# Patient Record
Sex: Male | Born: 1980 | Race: Black or African American | Hispanic: No | Marital: Married | Smoking: Never smoker
Health system: Southern US, Community
[De-identification: ages and names within clinical notes are randomized; demographics above are authoritative.]

## PROBLEM LIST (undated history)

## (undated) ENCOUNTER — Emergency Department (HOSPITAL_COMMUNITY): Admission: EM | Discharge status: Against Medical Advice | Payer: Self-pay

## (undated) DIAGNOSIS — T7840XA Allergy, unspecified, initial encounter: Secondary | ICD-10-CM

## (undated) DIAGNOSIS — I10 Essential (primary) hypertension: Secondary | ICD-10-CM

## (undated) DIAGNOSIS — K5720 Diverticulitis of large intestine with perforation and abscess without bleeding: Secondary | ICD-10-CM

## (undated) DIAGNOSIS — K651 Peritoneal abscess: Secondary | ICD-10-CM

## (undated) DIAGNOSIS — E119 Type 2 diabetes mellitus without complications: Secondary | ICD-10-CM

## (undated) HISTORY — DX: Allergy, unspecified, initial encounter: T78.40XA

## (undated) HISTORY — DX: Type 2 diabetes mellitus without complications: E11.9

## (undated) HISTORY — DX: Essential (primary) hypertension: I10

## (undated) HISTORY — PX: CARDIAC CATHETERIZATION: SHX172

## (undated) HISTORY — PX: FRACTURE SURGERY: SHX138

---

## 2011-11-27 ENCOUNTER — Ambulatory Visit (HOSPITAL_COMMUNITY)
Admission: RE | Admit: 2011-11-27 | Discharge: 2011-11-27 | Disposition: A | Discharge status: Home / Self Care | Payer: BC Managed Care – PPO | Source: Ambulatory Visit | Attending: Emergency Medicine | Admitting: Emergency Medicine

## 2011-11-27 ENCOUNTER — Emergency Department (HOSPITAL_COMMUNITY)
Admission: EM | Admit: 2011-11-27 | Discharge: 2011-11-27 | Disposition: A | Discharge status: Other Acute Inpatient Hospital | Payer: Self-pay

## 2011-11-27 ENCOUNTER — Ambulatory Visit (INDEPENDENT_AMBULATORY_CARE_PROVIDER_SITE_OTHER): Payer: BC Managed Care – PPO | Admitting: Emergency Medicine

## 2011-11-27 VITALS — BP 146/96 | HR 118 | Temp 100.9°F | Resp 16 | Ht 73.0 in | Wt 363.0 lb

## 2011-11-27 DIAGNOSIS — K7689 Other specified diseases of liver: Secondary | ICD-10-CM | POA: Insufficient documentation

## 2011-11-27 DIAGNOSIS — D72829 Elevated white blood cell count, unspecified: Secondary | ICD-10-CM | POA: Insufficient documentation

## 2011-11-27 DIAGNOSIS — R1031 Right lower quadrant pain: Secondary | ICD-10-CM | POA: Insufficient documentation

## 2011-11-27 DIAGNOSIS — R109 Unspecified abdominal pain: Secondary | ICD-10-CM

## 2011-11-27 DIAGNOSIS — K37 Unspecified appendicitis: Secondary | ICD-10-CM

## 2011-11-27 DIAGNOSIS — K5732 Diverticulitis of large intestine without perforation or abscess without bleeding: Secondary | ICD-10-CM | POA: Insufficient documentation

## 2011-11-27 DIAGNOSIS — R509 Fever, unspecified: Secondary | ICD-10-CM | POA: Insufficient documentation

## 2011-11-27 LAB — POCT CBC
Granulocyte percent: 69.3 %G (ref 37–80)
HCT, POC: 51 % (ref 43.5–53.7)
MCV: 90.4 fL (ref 80–97)
MPV: 8.4 fL (ref 0–99.8)
POC LYMPH PERCENT: 25.5 %L (ref 10–50)
RDW, POC: 13.9 %

## 2011-11-27 LAB — POCT UA - MICROSCOPIC ONLY
Casts, Ur, LPF, POC: NEGATIVE
Crystals, Ur, HPF, POC: NEGATIVE
Epithelial cells, urine per micros: NEGATIVE
Yeast, UA: NEGATIVE

## 2011-11-27 LAB — POCT URINALYSIS DIPSTICK
Leukocytes, UA: NEGATIVE
Nitrite, UA: NEGATIVE
Urobilinogen, UA: 0.2
pH, UA: 5

## 2011-11-27 MED ORDER — IOHEXOL 300 MG/ML  SOLN
100.0000 mL | Freq: Once | INTRAMUSCULAR | Status: AC | PRN
  Administered 2011-11-27: 100 mL via INTRAVENOUS

## 2011-11-27 NOTE — Progress Notes (Signed)
[facility] [address], [place] [address] [phone]  Date:  11/27/2011   Name:  Lawrence Rowland   DOB:  1980-12-01   MRN:  [id number]  PCP:  No primary provider on file.    Chief Complaint: Abdominal Pain and Fever   History of Present Illness:  Lawrence Rowland is a 31 y.o. very pleasant male patient who presents with the following:  Right lower quadrant abdominal pain and nausea and anorexia that started Sunday night.  No vomiting.  Pain initially lower abdomen but migrated into right lower quadrant.  Ate only a few forkfulls of dinner.  No known fever or chills.  Pain is sharp in character.  Last BM 11:00 AM and was scant.  Pain worse when walks or hit bumps in car  There is no problem list on file for this patient.   No past medical history on file.  Past Surgical History  Procedure Date  . Fracture surgery     History  Substance Use Topics  . Smoking status: Never Smoker   . Smokeless tobacco: Not on file  . Alcohol Use: No    Family History  Problem Relation Age of Onset  . Colon cancer Mother   . Diabetes Father     No Known Allergies  Medication list has been reviewed and updated.  No current outpatient prescriptions on file prior to visit.    Review of Systems:  As per HPI, otherwise negative.    Physical Examination: Filed Vitals:   11/27/11 2009  BP: 146/96  Pulse: 118  Temp: 100.9 F (38.3 C)  Resp: 16   Filed Vitals:   11/27/11 2009  Height: 6\' 1"  (1.854 m)  Weight: 363 lb (164.656 kg)   Body mass index is 47.89 kg/(m^2). Ideal Body Weight: Weight in (lb) to have BMI = 25: 189.1   GEN: obese, NAD, Non-toxic, A & O x 3 HEENT: Atraumatic, Normocephalic. Neck supple. No masses, No LAD. Ears and Nose: No external deformity. CV: RRR, No M/G/R. No JVD. No thrill. No extra heart sounds. PULM: CTA B, no wheezes, crackles, rhonchi. No retractions. No resp. distress. No accessory muscle use. ABD: S,  tender RLQ, ND, +BS. Direct and referred rebound in right lower abdomen. No HSM. EXTR: No c/c/e NEURO Normal gait.  PSYCH: Normally interactive. Conversant. Not depressed or anxious appearing.  Calm demeanor.    Assessment and Plan: Appendicitis  CBC and UA NPO To ER  Carmelina Dane, MD

## 2011-11-28 ENCOUNTER — Encounter (HOSPITAL_COMMUNITY): Payer: Self-pay | Admitting: Emergency Medicine

## 2011-11-28 ENCOUNTER — Inpatient Hospital Stay (HOSPITAL_COMMUNITY)
Admission: EM | Admit: 2011-11-28 | Discharge: 2011-12-02 | DRG: 183 | Disposition: A | Discharge status: Home / Self Care | Payer: BC Managed Care – PPO | Attending: General Surgery | Admitting: General Surgery

## 2011-11-28 DIAGNOSIS — K7689 Other specified diseases of liver: Secondary | ICD-10-CM | POA: Diagnosis present

## 2011-11-28 DIAGNOSIS — K760 Fatty (change of) liver, not elsewhere classified: Secondary | ICD-10-CM | POA: Diagnosis present

## 2011-11-28 DIAGNOSIS — K5732 Diverticulitis of large intestine without perforation or abscess without bleeding: Secondary | ICD-10-CM

## 2011-11-28 DIAGNOSIS — I10 Essential (primary) hypertension: Secondary | ICD-10-CM | POA: Diagnosis present

## 2011-11-28 DIAGNOSIS — Z483 Aftercare following surgery for neoplasm: Secondary | ICD-10-CM

## 2011-11-28 DIAGNOSIS — K5720 Diverticulitis of large intestine with perforation and abscess without bleeding: Secondary | ICD-10-CM | POA: Diagnosis present

## 2011-11-28 DIAGNOSIS — R1031 Right lower quadrant pain: Secondary | ICD-10-CM

## 2011-11-28 DIAGNOSIS — E119 Type 2 diabetes mellitus without complications: Secondary | ICD-10-CM | POA: Diagnosis present

## 2011-11-28 DIAGNOSIS — K5780 Diverticulitis of intestine, part unspecified, with perforation and abscess without bleeding: Secondary | ICD-10-CM

## 2011-11-28 LAB — CBC WITH DIFFERENTIAL/PLATELET
Eosinophils Absolute: 0 10*3/uL (ref 0.0–0.7)
Hemoglobin: 15.3 g/dL (ref 13.0–17.0)
Lymphocytes Relative: 23 % (ref 12–46)
Lymphs Abs: 2.9 10*3/uL (ref 0.7–4.0)
Neutro Abs: 8.4 10*3/uL — ABNORMAL HIGH (ref 1.7–7.7)
Neutrophils Relative %: 68 % (ref 43–77)
Platelets: 197 10*3/uL (ref 150–400)
RBC: 5.22 MIL/uL (ref 4.22–5.81)
WBC: 12.4 10*3/uL — ABNORMAL HIGH (ref 4.0–10.5)

## 2011-11-28 LAB — URINALYSIS, ROUTINE W REFLEX MICROSCOPIC
Bilirubin Urine: NEGATIVE
Ketones, ur: 40 mg/dL — AB
Leukocytes, UA: NEGATIVE
Nitrite: NEGATIVE
Protein, ur: NEGATIVE mg/dL
pH: 5 (ref 5.0–8.0)

## 2011-11-28 LAB — BASIC METABOLIC PANEL
BUN: 8 mg/dL (ref 6–23)
Calcium: 8.9 mg/dL (ref 8.4–10.5)
Chloride: 96 mEq/L (ref 96–112)
Creatinine, Ser: 0.79 mg/dL (ref 0.50–1.35)
GFR calc Af Amer: >90 mL/min (ref >90)

## 2011-11-28 LAB — CBC
HCT: 42.1 % (ref 39.0–52.0)
MCHC: 34.2 g/dL (ref 30.0–36.0)
MCV: 85.1 fL (ref 78.0–100.0)
Platelets: 194 10*3/uL (ref 150–400)
RDW: 13.1 % (ref 11.5–15.5)

## 2011-11-28 LAB — COMPREHENSIVE METABOLIC PANEL
ALT: 26 U/L (ref 0–53)
Alkaline Phosphatase: 49 U/L (ref 39–117)
BUN: 8 mg/dL (ref 6–23)
CO2: 23 mEq/L (ref 19–32)
Chloride: 96 mEq/L (ref 96–112)
GFR calc Af Amer: >90 mL/min (ref >90)
GFR calc non Af Amer: >90 mL/min (ref >90)
Glucose, Bld: 277 mg/dL — ABNORMAL HIGH (ref 70–99)
Potassium: 3.7 mEq/L (ref 3.5–5.1)
Sodium: 132 mEq/L — ABNORMAL LOW (ref 135–145)
Total Bilirubin: 0.9 mg/dL (ref 0.3–1.2)
Total Protein: 8 g/dL (ref 6.0–8.3)

## 2011-11-28 MED ORDER — PANTOPRAZOLE SODIUM 40 MG IV SOLR
40.0000 mg | Freq: Every day | INTRAVENOUS | Status: DC
  Administered 2011-11-28 – 2011-12-01 (×4): 40 mg via INTRAVENOUS
  Filled 2011-11-28 (×5): qty 40

## 2011-11-28 MED ORDER — DIPHENHYDRAMINE HCL 50 MG/ML IJ SOLN: 12.5000 mg | Freq: Four times a day (QID) | INTRAMUSCULAR | Status: DC | PRN

## 2011-11-28 MED ORDER — ONDANSETRON HCL 4 MG/2ML IJ SOLN: 4.0000 mg | Freq: Four times a day (QID) | INTRAMUSCULAR | Status: DC | PRN

## 2011-11-28 MED ORDER — ONDANSETRON HCL 4 MG/2ML IJ SOLN
4.0000 mg | Freq: Once | INTRAMUSCULAR | Status: AC
  Administered 2011-11-28: 4 mg via INTRAVENOUS
  Filled 2011-11-28: qty 2

## 2011-11-28 MED ORDER — DIPHENHYDRAMINE HCL 12.5 MG/5ML PO ELIX
12.5000 mg | ORAL_SOLUTION | Freq: Four times a day (QID) | ORAL | Status: DC | PRN
  Filled 2011-11-28: qty 10

## 2011-11-28 MED ORDER — CIPROFLOXACIN IN D5W 400 MG/200ML IV SOLN
400.0000 mg | Freq: Two times a day (BID) | INTRAVENOUS | Status: DC
  Administered 2011-11-28 – 2011-12-02 (×9): 400 mg via INTRAVENOUS
  Filled 2011-11-28 (×10): qty 200

## 2011-11-28 MED ORDER — METRONIDAZOLE IN NACL 5-0.79 MG/ML-% IV SOLN
500.0000 mg | Freq: Once | INTRAVENOUS | Status: AC
  Administered 2011-11-28: 500 mg via INTRAVENOUS
  Filled 2011-11-28: qty 100

## 2011-11-28 MED ORDER — HYDROMORPHONE HCL PF 1 MG/ML IJ SOLN
INTRAMUSCULAR | Status: AC
  Filled 2011-11-28: qty 2

## 2011-11-28 MED ORDER — ENOXAPARIN SODIUM 40 MG/0.4ML ~~LOC~~ SOLN
40.0000 mg | SUBCUTANEOUS | Status: DC
  Administered 2011-11-28 – 2011-12-01 (×4): 40 mg via SUBCUTANEOUS
  Filled 2011-11-28 (×4): qty 0.4

## 2011-11-28 MED ORDER — KCL IN DEXTROSE-NACL 20-5-0.45 MEQ/L-%-% IV SOLN
INTRAVENOUS | Status: DC
  Administered 2011-11-28 – 2011-11-29 (×3): via INTRAVENOUS
  Filled 2011-11-28 (×6): qty 1000

## 2011-11-28 MED ORDER — HYDROMORPHONE HCL PF 1 MG/ML IJ SOLN
1.0000 mg | INTRAMUSCULAR | Status: DC | PRN
  Administered 2011-11-28 – 2011-11-29 (×7): 2 mg via INTRAVENOUS
  Filled 2011-11-28 (×6): qty 2

## 2011-11-28 MED ORDER — HYDROMORPHONE HCL PF 1 MG/ML IJ SOLN
1.0000 mg | INTRAMUSCULAR | Status: AC
Start: 2011-11-28 — End: 2011-11-28
  Administered 2011-11-28: 1 mg via INTRAVENOUS
  Filled 2011-11-28: qty 1

## 2011-11-28 MED ORDER — METRONIDAZOLE IN NACL 5-0.79 MG/ML-% IV SOLN
500.0000 mg | Freq: Three times a day (TID) | INTRAVENOUS | Status: DC
  Administered 2011-11-28 – 2011-12-01 (×9): 500 mg via INTRAVENOUS
  Filled 2011-11-28 (×10): qty 100

## 2011-11-28 MED ORDER — CIPROFLOXACIN IN D5W 400 MG/200ML IV SOLN
400.0000 mg | Freq: Once | INTRAVENOUS | Status: AC
  Administered 2011-11-28: 400 mg via INTRAVENOUS
  Filled 2011-11-28: qty 200

## 2011-11-28 NOTE — ED Notes (Signed)
Family at bedside. 

## 2011-11-28 NOTE — H&P (Signed)
Lawrence Rowland is an 31 y.o. male.   Chief Complaint: Abdominal pain HPI: Patient is an otherwise healthy male who developed lower abdominal discomfort 2 days ago. It became worse the following day and he was evaluated at [facility] urgent care. He was referred for CT scan of the abdomen and pelvis. This demonstrates sigmoid diverticulitis with microperforation. I was asked to evaluate him for admission to our surgical service. He continues to complain of some lower abdominal pain just right of the midline.  History reviewed. No pertinent past medical history.  Past Surgical History  Procedure Date  . Fracture surgery     Family History  Problem Relation Age of Onset  . Colon cancer Mother   . Diabetes Father    Social History:  reports that he has never smoked. He does not have any smokeless tobacco history on file. He reports that he does not drink alcohol or use illicit drugs. Works as an Airline pilot  Allergies: No Known Allergies   (Not in a hospital admission)  Results for orders placed during the hospital encounter of 11/28/11 (from the past 48 hour(s))  CBC WITH DIFFERENTIAL     Status: Abnormal   Collection Time   11/28/11 12:32 AM      Component Value Range Comment   WBC 12.4 (*) 4.0 - 10.5 K/uL    RBC 5.22  4.22 - 5.81 MIL/uL    Hemoglobin 15.3  13.0 - 17.0 g/dL    HCT 41.3  24.4 - 01.0 %    MCV 83.5  78.0 - 100.0 fL    MCH 29.3  26.0 - 34.0 pg    MCHC 35.1  30.0 - 36.0 g/dL    RDW 27.2  53.6 - 64.4 %    Platelets 197  150 - 400 K/uL    Neutrophils Relative 68  43 - 77 %    Neutro Abs 8.4 (*) 1.7 - 7.7 K/uL    Lymphocytes Relative 23  12 - 46 %    Lymphs Abs 2.9  0.7 - 4.0 K/uL    Monocytes Relative 9  3 - 12 %    Monocytes Absolute 1.1 (*) 0.1 - 1.0 K/uL    Eosinophils Relative 0  0 - 5 %    Eosinophils Absolute 0.0  0.0 - 0.7 K/uL    Basophils Relative 0  0 - 1 %    Basophils Absolute 0.0  0.0 - 0.1 K/uL   COMPREHENSIVE METABOLIC PANEL     Status: Abnormal     Collection Time   11/28/11 12:32 AM      Component Value Range Comment   Sodium 132 (*) 135 - 145 mEq/L    Potassium 3.7  3.5 - 5.1 mEq/L    Chloride 96  96 - 112 mEq/L    CO2 23  19 - 32 mEq/L    Glucose, Bld 277 (*) 70 - 99 mg/dL    BUN 8  6 - 23 mg/dL    Creatinine, Ser 0.34  0.50 - 1.35 mg/dL    Calcium 9.2  8.4 - 74.2 mg/dL    Total Protein 8.0  6.0 - 8.3 g/dL    Albumin 3.5  3.5 - 5.2 g/dL    AST 18  0 - 37 U/L    ALT 26  0 - 53 U/L    Alkaline Phosphatase 49  39 - 117 U/L    Total Bilirubin 0.9  0.3 - 1.2 mg/dL    GFR calc non  Af Amer >90  >90 mL/min    GFR calc Af Amer >90  >90 mL/min    Ct Abdomen Pelvis W Contrast  11/27/2011  *RADIOLOGY REPORT*  Clinical Data: Leukocytosis.  Right lower quadrant pain.  CT ABDOMEN AND PELVIS WITH CONTRAST  Technique:  Multidetector CT imaging of the abdomen and pelvis was performed following the standard protocol during bolus administration of intravenous contrast.  Contrast: OMNIPAQUE IOHEXOL 300 MG/ML  SOLN  Comparison: None.  Findings: The lung bases are clear without focal nodule, mass, or airspace disease.  The heart size is normal.  No significant pleural or pericardial effusion is present.  There is diffuse fatty infiltration of the liver.  No focal hepatic lesions are present.  The spleen is within normal limits.  The stomach, duodenum, pancreas are unremarkable.  The common bile duct and gallbladder are normal.  The adrenal glands are normal bilaterally.  The kidneys and ureters are unremarkable.  Diffuse inflammatory changes are present to about the sigmoid colon at a point where it sweeps to the right, just below the appendix. A small amount of free air is present, compatible with a focal perforation.  This may be contained.  There is no discrete abscess. The more proximal colon is remarkable for mild secondary inflammation of the cecum adjacent to the inflamed area. The remainder of the colon is unremarkable.  There is mild focal  inflammation of a loop of small bowel extending past the inflamed sigmoid colon.  The appendix is visualized and within normal limits.  The small bowel is within normal limits.  No significant adenopathy is present.  The urinary bladder is within normal limits.  The bone windows are unremarkable.  IMPRESSION:  1.  Sigmoid diverticulitis. 2.  A small amount of free air is compatible with a focal perforation, likely contained without an abscess. 3.  Secondary inflammation of at least one loop of small bowel adjacent to the diverticulitis. 4.  Hepatic steatosis.   Original Report Authenticated By: [name]. MATTERN, M.D.     Review of Systems  Constitutional: Negative.   HENT: Negative.   Eyes: Negative.   Respiratory: Negative.   Cardiovascular: Negative.   Gastrointestinal: Positive for abdominal pain and constipation.  Genitourinary: Negative.   Skin: Negative.   Neurological: Negative.   Endo/Heme/Allergies: Negative.     Blood pressure 137/87, pulse 97, temperature 98.6 F (37 C), temperature source Oral, resp. rate 16, SpO2 97.00%. Physical Exam  Constitutional: He is oriented to person, place, and time. He appears well-developed and well-nourished. No distress.  HENT:  Head: Normocephalic and atraumatic.  Mouth/Throat: Oropharynx is clear and moist. No oropharyngeal exudate.  Eyes: Pupils are equal, round, and reactive to light.  Neck: Normal range of motion. Neck supple. No tracheal deviation present.  Cardiovascular: Normal rate, regular rhythm, normal heart sounds and intact distal pulses.   Respiratory: Effort normal and breath sounds normal. No respiratory distress. He has no wheezes. He has no rales.  GI: Soft. He exhibits no distension. There is tenderness. There is no rebound and no guarding.       Tenderness lower midline and to the right of lower midline without guarding  Musculoskeletal: Normal range of motion.  Neurological: He is alert and oriented to person,  place, and time.     Assessment/Plan Sigmoid diverticulitis with microperforation. Will admit, place on bowel rest, and continue  IV antibiotics.I advised him if he worsens clinically he may require surgery with likely colostomy. Hopefully we  can get him through this with medical management. I answered his questions. Journiee Feldkamp E 11/28/2011, 2:04 AM

## 2011-11-28 NOTE — Progress Notes (Addendum)
Inpatient Diabetes Program Recommendations  AACE/ADA: New Consensus Statement on Inpatient Glycemic Control (2013)  Target Ranges:  Prepandial:   less than 140 mg/dL      Peak postprandial:   less than 180 mg/dL (1-2 hours)      Critically ill patients:  140 - 180 mg/dL   Results for DJ, SENTENO (MRN [id number]) as of 11/28/2011 10:38  Ref. Range 11/28/2011 00:32 11/28/2011 05:55  Glucose Latest Range: 70-99 mg/dL 782 (H) 956 (H)   May want to also consider checking a HgbA1c level on this patient- Multiple risk factors for DM: Obesity, Family history.  Inpatient Diabetes Program Recommendations Correction (SSI): Please check CBGs and cover with Novolog Sensitive correction scale (SSI) Q4 hours if CBGs are elevated.  Note: Will follow. Ambrose Finland RN, MSN, CDE Diabetes Coordinator Inpatient Diabetes Program [phone]

## 2011-11-28 NOTE — Progress Notes (Signed)
Agree with PA-Osbornes' note. -NPO -Abx Bowel rest

## 2011-11-28 NOTE — ED Provider Notes (Signed)
History     CSN: [id number]  Arrival date & time 11/28/11  0008   First MD Initiated Contact with Patient 11/28/11 0020      Chief Complaint  Patient presents with  . Abdominal Pain    (Consider location/radiation/quality/duration/timing/severity/associated sxs/prior treatment) HPITyreese Rowland is a 30 y.o. male who arrives from [place] urgent care who received a CT showing diverticulitis with small perforation. Since pain began on Sunday as pressure in the right lower cautery this is progressed to sharp 8-10/10 pain, he's had nausea without vomiting, pain does not radiate, is worse with walking or going over bumps in the car. Last time he had anything to eat or drink was 7:00 last night. He did have a bowel movement today at 11:00. This bout of pain was preceded by a day or 2 of constipation. No blood in his stools, no fevers or chills.  History reviewed. No pertinent past medical history.  Past Surgical History  Procedure Date  . Fracture surgery     Family History  Problem Relation Age of Onset  . Colon cancer Mother   . Diabetes Father     History  Substance Use Topics  . Smoking status: Never Smoker   . Smokeless tobacco: Not on file  . Alcohol Use: No      Review of Systems At least 10pt or greater review of systems completed and are negative except where specified in the HPI.  Allergies  Review of patient's allergies indicates no known allergies.  Home Medications  No current outpatient prescriptions on file.  BP 137/87  Pulse 97  Temp 98.6 F (37 C) (Oral)  Resp 16  SpO2 97%  Physical Exam  Nursing notes reviewed.  Electronic medical record reviewed. VITAL SIGNS:   Filed Vitals:   11/28/11 0245 11/28/11 0300 11/28/11 0342 11/28/11 0542  BP: 138/73 126/74 143/74 136/82  Pulse: 100 97 98 91  Temp:   98.6 F (37 C) 98.9 F (37.2 C)  TempSrc:   Oral   Resp: 29 28 22 18   Height:   6\' 1"  (1.854 m)   Weight:   358 lb 14.5 oz (162.8 kg)     SpO2: 90% 91% 94% 95%   CONSTITUTIONAL: Awake, oriented, appears non-toxic HENT: Atraumatic, normocephalic, oral mucosa pink and moist, airway patent. Nares patent without drainage. External ears normal. EYES: Conjunctiva clear, EOMI, PERRLA NECK: Trachea midline, non-tender, supple CARDIOVASCULAR: Normal heart rate, Normal rhythm, No murmurs, rubs, gallops PULMONARY/CHEST: Clear to auscultation, no rhonchi, wheezes, or rales. Symmetrical breath sounds. Non-tender. ABDOMINAL: Non-distended, morbidly obese, soft, tender to palpation in the right lower quadrant with rebound,  BS normal. NEUROLOGIC: Non-focal, moving all four extremities, no gross sensory or motor deficits. EXTREMITIES: No clubbing, cyanosis, or edema SKIN: Warm, Dry, No erythema, No rash  ED Course  Procedures (including critical care time)  Labs Reviewed  CBC WITH DIFFERENTIAL - Abnormal; Notable for the following:    WBC 12.4 (*)     Neutro Abs 8.4 (*)     Monocytes Absolute 1.1 (*)     All other components within normal limits  COMPREHENSIVE METABOLIC PANEL - Abnormal; Notable for the following:    Sodium 132 (*)     Glucose, Bld 277 (*)     All other components within normal limits  URINALYSIS, ROUTINE W REFLEX MICROSCOPIC - Abnormal; Notable for the following:    Glucose, UA 500 (*)     Ketones, ur 40 (*)     All other  components within normal limits  BASIC METABOLIC PANEL - Abnormal; Notable for the following:    Sodium 133 (*)     Glucose, Bld 249 (*)     All other components within normal limits  CBC - Abnormal; Notable for the following:    WBC 12.6 (*)     All other components within normal limits   Ct Abdomen Pelvis W Contrast  11/27/2011  *RADIOLOGY REPORT*  Clinical Data: Leukocytosis.  Right lower quadrant pain.  CT ABDOMEN AND PELVIS WITH CONTRAST  Technique:  Multidetector CT imaging of the abdomen and pelvis was performed following the standard protocol during bolus administration of  intravenous contrast.  Contrast: OMNIPAQUE IOHEXOL 300 MG/ML  SOLN  Comparison: None.  Findings: The lung bases are clear without focal nodule, mass, or airspace disease.  The heart size is normal.  No significant pleural or pericardial effusion is present.  There is diffuse fatty infiltration of the liver.  No focal hepatic lesions are present.  The spleen is within normal limits.  The stomach, duodenum, pancreas are unremarkable.  The common bile duct and gallbladder are normal.  The adrenal glands are normal bilaterally.  The kidneys and ureters are unremarkable.  Diffuse inflammatory changes are present to about the sigmoid colon at a point where it sweeps to the right, just below the appendix. A small amount of free air is present, compatible with a focal perforation.  This may be contained.  There is no discrete abscess. The more proximal colon is remarkable for mild secondary inflammation of the cecum adjacent to the inflamed area. The remainder of the colon is unremarkable.  There is mild focal inflammation of a loop of small bowel extending past the inflamed sigmoid colon.  The appendix is visualized and within normal limits.  The small bowel is within normal limits.  No significant adenopathy is present.  The urinary bladder is within normal limits.  The bone windows are unremarkable.  IMPRESSION:  1.  Sigmoid diverticulitis. 2.  A small amount of free air is compatible with a focal perforation, likely contained without an abscess. 3.  Secondary inflammation of at least one loop of small bowel adjacent to the diverticulitis. 4.  Hepatic steatosis.   Original Report Authenticated By: [name]. MATTERN, M.D.      1. Right lower quadrant abdominal pain   2. Diverticulitis with perforation       MDM  Lawrence Rowland is a 31 y.o. male presenting with right lower quadrant pain it is actually a diverticulitis with a small perforation. This is diagnosed by CT-patient does have rebound  tenderness. Starting the patient on fluids, pain control, antiemetics and IV antibiotics metronidazole and ciprofloxacin. Have discussed patient's case with surgery they will admit the patient for observation, further diagnostics and treatment.  Appendix was well-visualized on this scan-does not appear to be appendicitis. Patient is nontoxic and afebrile in the emergency department his pain is reasonably well-controlled.         Jones Skene, MD 11/28/11 4782

## 2011-11-28 NOTE — Progress Notes (Signed)
UR chart review completed.  

## 2011-11-28 NOTE — ED Notes (Signed)
Pt in route for transport.

## 2011-11-28 NOTE — Progress Notes (Signed)
Patient ID: Lawrence Rowland, male   DOB: [birth year], 31 y.o.   MRN: [id number]    Subjective: Pt feels about the same as when he came in.  Still with quite a bit of pain on the right side.  No nausea or vomiting  Objective: Vital signs in last 24 hours: Temp:  [98.6 F (37 C)-100.9 F (38.3 C)] 98.9 F (37.2 C) (10/30 0542) Pulse Rate:  [91-118] 91  (10/30 0542) Resp:  [14-31] 18  (10/30 0542) BP: (126-146)/(70-104) 136/82 mmHg (10/30 0542) SpO2:  [90 %-97 %] 95 % (10/30 0542) Weight:  [358 lb 14.5 oz (162.8 kg)-363 lb (164.656 kg)] 358 lb 14.5 oz (162.8 kg) (10/30 0342)    Intake/Output from previous day:   Intake/Output this shift:    PE: Abd: soft, tender mostly in RLQ, few BS, obese, ND Heart: regular Lungs: CTAB  Lab Results:   Basename 11/28/11 0555 11/28/11 0032  WBC 12.6* 12.4*  HGB 14.4 15.3  HCT 42.1 43.6  PLT 194 197   BMET  Basename 11/28/11 0555 11/28/11 0032  NA 133* 132*  K 3.5 3.7  CL 96 96  CO2 26 23  GLUCOSE 249* 277*  BUN 8 8  CREATININE 0.79 0.79  CALCIUM 8.9 9.2   PT/INR No results found for this basename: LABPROT:2,INR:2 in the last 72 hours CMP     Component Value Date/Time   NA 133* 11/28/2011 0555   K 3.5 11/28/2011 0555   CL 96 11/28/2011 0555   CO2 26 11/28/2011 0555   GLUCOSE 249* 11/28/2011 0555   BUN 8 11/28/2011 0555   CREATININE 0.79 11/28/2011 0555   CALCIUM 8.9 11/28/2011 0555   PROT 8.0 11/28/2011 0032   ALBUMIN 3.5 11/28/2011 0032   AST 18 11/28/2011 0032   ALT 26 11/28/2011 0032   ALKPHOS 49 11/28/2011 0032   BILITOT 0.9 11/28/2011 0032   GFRNONAA >90 11/28/2011 0555   GFRAA >90 11/28/2011 0555   Lipase  No results found for this basename: lipase       Studies/Results: Ct Abdomen Pelvis W Contrast  11/27/2011  *RADIOLOGY REPORT*  Clinical Data: Leukocytosis.  Right lower quadrant pain.  CT ABDOMEN AND PELVIS WITH CONTRAST  Technique:  Multidetector CT imaging of the abdomen and pelvis was performed  following the standard protocol during bolus administration of intravenous contrast.  Contrast: OMNIPAQUE IOHEXOL 300 MG/ML  SOLN  Comparison: None.  Findings: The lung bases are clear without focal nodule, mass, or airspace disease.  The heart size is normal.  No significant pleural or pericardial effusion is present.  There is diffuse fatty infiltration of the liver.  No focal hepatic lesions are present.  The spleen is within normal limits.  The stomach, duodenum, pancreas are unremarkable.  The common bile duct and gallbladder are normal.  The adrenal glands are normal bilaterally.  The kidneys and ureters are unremarkable.  Diffuse inflammatory changes are present to about the sigmoid colon at a point where it sweeps to the right, just below the appendix. A small amount of free air is present, compatible with a focal perforation.  This may be contained.  There is no discrete abscess. The more proximal colon is remarkable for mild secondary inflammation of the cecum adjacent to the inflamed area. The remainder of the colon is unremarkable.  There is mild focal inflammation of a loop of small bowel extending past the inflamed sigmoid colon.  The appendix is visualized and within normal limits.  The small bowel  is within normal limits.  No significant adenopathy is present.  The urinary bladder is within normal limits.  The bone windows are unremarkable.  IMPRESSION:  1.  Sigmoid diverticulitis. 2.  A small amount of free air is compatible with a focal perforation, likely contained without an abscess. 3.  Secondary inflammation of at least one loop of small bowel adjacent to the diverticulitis. 4.  Hepatic steatosis.   Original Report Authenticated By: [name]. MATTERN, M.D.     Anti-infectives: Anti-infectives     Start     Dose/Rate Route Frequency Ordered Stop   11/28/11 0800   metroNIDAZOLE (FLAGYL) IVPB 500 mg        500 mg 100 mL/hr over 60 Minutes Intravenous Every 8 hours 11/28/11 0342      11/28/11 0400   ciprofloxacin (CIPRO) IVPB 400 mg        400 mg 200 mL/hr over 60 Minutes Intravenous Every 12 hours 11/28/11 0342     11/28/11 0045   metroNIDAZOLE (FLAGYL) IVPB 500 mg        500 mg 100 mL/hr over 60 Minutes Intravenous  Once 11/28/11 0032 11/28/11 0249   11/28/11 0045   ciprofloxacin (CIPRO) IVPB 400 mg        400 mg 200 mL/hr over 60 Minutes Intravenous  Once 11/28/11 0032 11/28/11 0353           Assessment/Plan  1. Diverticulitis, microperforation 2. Morbid obesity  Plan: 1. Cont NPO and bowel rest 2. Cont IV abx therapy, if patient does not improve or WBC begins to trend back up may need to switch to something stronger than Cipro Flagyl, such as zosyn or Invanz. 3. mobilize   LOS: 0 days    Terrelle Ruffolo E 11/28/2011, 8:39 AM Pager: [phone]

## 2011-11-28 NOTE — ED Notes (Signed)
PT. REPORTS MID ABDOMINAL PAIN WITH  NAUSEA ONSET LAST SUNDAY WORSE TODAY SEN AT [facility] SENT HERE FOR ABDOMINAL CT SCAN RESULTS SHOWS DIVERTICULITIS  / PERFORATION .

## 2011-11-29 DIAGNOSIS — E119 Type 2 diabetes mellitus without complications: Secondary | ICD-10-CM

## 2011-11-29 LAB — CBC
MCH: 29.3 pg (ref 26.0–34.0)
MCHC: 34.3 g/dL (ref 30.0–36.0)
Platelets: 186 10*3/uL (ref 150–400)

## 2011-11-29 LAB — GLUCOSE, CAPILLARY
Glucose-Capillary: 165 mg/dL — ABNORMAL HIGH (ref 70–99)
Glucose-Capillary: 167 mg/dL — ABNORMAL HIGH (ref 70–99)

## 2011-11-29 MED ORDER — INSULIN ASPART 100 UNIT/ML ~~LOC~~ SOLN
0.0000 [IU] | Freq: Three times a day (TID) | SUBCUTANEOUS | Status: DC
  Administered 2011-11-29: 7 [IU] via SUBCUTANEOUS
  Administered 2011-11-30: 4 [IU] via SUBCUTANEOUS

## 2011-11-29 MED ORDER — POTASSIUM CHLORIDE IN NACL 20-0.9 MEQ/L-% IV SOLN
INTRAVENOUS | Status: DC
  Administered 2011-11-29 – 2011-12-01 (×4): via INTRAVENOUS
  Filled 2011-11-29 (×8): qty 1000

## 2011-11-29 NOTE — Progress Notes (Signed)
Agree with PA-Dort's PN -Eval HgbA1C -patient may need ISS -COn't abx -ambulate

## 2011-11-29 NOTE — Progress Notes (Addendum)
Patient ID: Lawrence Rowland, male   DOB: 01-26-1981, 31 y.o.   MRN: [id number]    Subjective: Pt feeling about 40-50% better than when he came in today.  Pt had a BM yesterday, urinating okay, pt still experiencing some nausea and anorexia, but is thirsty.  Pt tolerating ice chips okay.  Pt ambulating OOB.  Objective: Vital signs in last 24 hours: Temp:  [98.7 F (37.1 C)-100.6 F (38.1 C)] 98.9 F (37.2 C) (10/31 0621) Pulse Rate:  [89-95] 89  (10/31 0621) Resp:  [18] 18  (10/31 0621) BP: (134-151)/(73-83) 137/83 mmHg (10/31 0621) SpO2:  [93 %-99 %] 96 % (10/31 0621) Last BM Date: 11/28/11  Intake/Output from previous day: 10/30 0701 - 10/31 0700 In: 3264.3 [I.V.:2664.3; IV Piggyback:600] Out: -  Intake/Output this shift:    PE: Gen:  Alert, NAD, pleasant Abd: Soft, +BS, entire abdomen moderate TTP which refers pain to RLQ, some mild guarding, mild distension   Lab Results:   Basename 11/29/11 0500 11/28/11 0555  WBC 12.0* 12.6*  HGB 13.7 14.4  HCT 39.9 42.1  PLT 186 194   BMET  Basename 11/28/11 0555 11/28/11 0032  NA 133* 132*  K 3.5 3.7  CL 96 96  CO2 26 23  GLUCOSE 249* 277*  BUN 8 8  CREATININE 0.79 0.79  CALCIUM 8.9 9.2   PT/INR No results found for this basename: LABPROT:2,INR:2 in the last 72 hours CMP     Component Value Date/Time   NA 133* 11/28/2011 0555   K 3.5 11/28/2011 0555   CL 96 11/28/2011 0555   CO2 26 11/28/2011 0555   GLUCOSE 249* 11/28/2011 0555   BUN 8 11/28/2011 0555   CREATININE 0.79 11/28/2011 0555   CALCIUM 8.9 11/28/2011 0555   PROT 8.0 11/28/2011 0032   ALBUMIN 3.5 11/28/2011 0032   AST 18 11/28/2011 0032   ALT 26 11/28/2011 0032   ALKPHOS 49 11/28/2011 0032   BILITOT 0.9 11/28/2011 0032   GFRNONAA >90 11/28/2011 0555   GFRAA >90 11/28/2011 0555   Lipase  No results found for this basename: lipase       Studies/Results: Ct Abdomen Pelvis W Contrast  11/27/2011  *RADIOLOGY REPORT*  Clinical Data:  Leukocytosis.  Right lower quadrant pain.  CT ABDOMEN AND PELVIS WITH CONTRAST  Technique:  Multidetector CT imaging of the abdomen and pelvis was performed following the standard protocol during bolus administration of intravenous contrast.  Contrast: OMNIPAQUE IOHEXOL 300 MG/ML  SOLN  Comparison: None.  Findings: The lung bases are clear without focal nodule, mass, or airspace disease.  The heart size is normal.  No significant pleural or pericardial effusion is present.  There is diffuse fatty infiltration of the liver.  No focal hepatic lesions are present.  The spleen is within normal limits.  The stomach, duodenum, pancreas are unremarkable.  The common bile duct and gallbladder are normal.  The adrenal glands are normal bilaterally.  The kidneys and ureters are unremarkable.  Diffuse inflammatory changes are present to about the sigmoid colon at a point where it sweeps to the right, just below the appendix. A small amount of free air is present, compatible with a focal perforation.  This may be contained.  There is no discrete abscess. The more proximal colon is remarkable for mild secondary inflammation of the cecum adjacent to the inflamed area. The remainder of the colon is unremarkable.  There is mild focal inflammation of a loop of small bowel extending past the inflamed  sigmoid colon.  The appendix is visualized and within normal limits.  The small bowel is within normal limits.  No significant adenopathy is present.  The urinary bladder is within normal limits.  The bone windows are unremarkable.  IMPRESSION:  1.  Sigmoid diverticulitis. 2.  A small amount of free air is compatible with a focal perforation, likely contained without an abscess. 3.  Secondary inflammation of at least one loop of small bowel adjacent to the diverticulitis. 4.  Hepatic steatosis.   Original Report Authenticated By: [name]. MATTERN, M.D.     Anti-infectives: Anti-infectives     Start     Dose/Rate Route  Frequency Ordered Stop   11/28/11 0800   metroNIDAZOLE (FLAGYL) IVPB 500 mg        500 mg 100 mL/hr over 60 Minutes Intravenous Every 8 hours 11/28/11 0342     11/28/11 0400   ciprofloxacin (CIPRO) IVPB 400 mg        400 mg 200 mL/hr over 60 Minutes Intravenous Every 12 hours 11/28/11 0342     11/28/11 0045   metroNIDAZOLE (FLAGYL) IVPB 500 mg        500 mg 100 mL/hr over 60 Minutes Intravenous  Once 11/28/11 0032 11/28/11 0249   11/28/11 0045   ciprofloxacin (CIPRO) IVPB 400 mg        400 mg 200 mL/hr over 60 Minutes Intravenous  Once 11/28/11 0032 11/28/11 0353           Assessment/Plan Diverticulitis micro perf 1.  Will keep NPO except ice chips given some nausea and continued pain 2.  Cont. Ambulation OOB 3.  IS 4.  Cont. Antibiotics  Morbid Obesity, Elevated glucose (previously diagnosed with Pre-DM & was on metformin) 1.  Switch from D5W to NS, if not improved by this afternoon will start on sliding scale 2.  May need medicine consult prior to discharge/PCP referral   LOS: 1 day    Aris Georgia 11/29/2011, 7:54 AM Pager: [phone]   Time:  1328 Started patient on Insulin sliding scale due to blood glucose readings >200 even after d/c D5W.  If not improved by tomorrow AM will consult medicine on management of diabetes.

## 2011-11-30 DIAGNOSIS — R1031 Right lower quadrant pain: Secondary | ICD-10-CM

## 2011-11-30 LAB — GLUCOSE, CAPILLARY
Glucose-Capillary: 181 mg/dL — ABNORMAL HIGH (ref 70–99)
Glucose-Capillary: 178 mg/dL — ABNORMAL HIGH (ref 70–99)
Glucose-Capillary: 269 mg/dL — ABNORMAL HIGH (ref 70–99)

## 2011-11-30 LAB — HEMOGLOBIN A1C: Hgb A1c MFr Bld: 13.5 % — ABNORMAL HIGH (ref <5.7)

## 2011-11-30 MED ORDER — INSULIN GLARGINE 100 UNIT/ML ~~LOC~~ SOLN
10.0000 [IU] | Freq: Every day | SUBCUTANEOUS | Status: DC
  Administered 2011-11-30: 10 [IU] via SUBCUTANEOUS

## 2011-11-30 MED ORDER — INSULIN ASPART 100 UNIT/ML ~~LOC~~ SOLN
0.0000 [IU] | Freq: Three times a day (TID) | SUBCUTANEOUS | Status: DC
  Administered 2011-11-30: 8 [IU] via SUBCUTANEOUS
  Administered 2011-11-30: 3 [IU] via SUBCUTANEOUS

## 2011-11-30 MED ORDER — LIVING WELL WITH DIABETES BOOK
Freq: Once | Status: AC
  Administered 2011-11-30: 18:00:00
  Filled 2011-11-30: qty 1

## 2011-11-30 MED ORDER — INSULIN ASPART 100 UNIT/ML ~~LOC~~ SOLN
0.0000 [IU] | SUBCUTANEOUS | Status: DC
  Administered 2011-12-01 (×6): 3 [IU] via SUBCUTANEOUS

## 2011-11-30 NOTE — Progress Notes (Signed)
Rn gave patient Living Well with Diabetes booklet. Patient has watched 4 videos on Dm and plans to watch the rest this evening. Rn instructed patient on how to draw up insulin and inject self. Pt is aware that at this point we aren't sure whether he will go home on insulin or oral meds for DM. Pt was able to draw up insulin and Rn gave med to patient. Pt said that he was at one point on metformin but his PCP stopped it because he started working out and lost weight. Pt however did not check his blood sugars at home. Rn informed patient that he would be checking his blood sugars at home. Rn will report off to next nurse that patient needs to watch NT's when taking blood sugar and needs to be taught how to take it. Pt knows to call if he has any questions.

## 2011-11-30 NOTE — Progress Notes (Signed)
  RD consulted for nutrition education regarding diabetes.  RD met with pt at bedside to identify primary learner and learning style.  Pt states he is responsible for his meals.  He reports his learning style as discussion preferred with handouts for later reference.  Lab Results  Component Value Date   HGBA1C 13.5* 11/29/2011    RD provided "Carbohydrate Counting for People with Diabetes" handout from the Academy of [employer]. Discussed different food groups and their effects on blood sugar, emphasizing carbohydrate-containing foods. Provided list of carbohydrates and recommended serving sizes of common foods.  Discussed importance of controlled and consistent carbohydrate intake throughout the day. Provided examples of ways to balance meals/snacks and encouraged intake of high-fiber, whole grain complex carbohydrates.  Pt reports previous dx with diabetes several years ago for which he used metformin.  At some point during that time, pt reports he was able to achieve good control and stopped medication use.  He had a feeling his blood sugars were not in good control, but reports some denial.  His ultimate goal is to get off insulin as he does not like shots.  He also has long term goals of being healthy and at a more healthy weight.  He has tried several diets (fad, restrictive, and general) without much success.  Expect good compliance.  Body mass index is 47.35 kg/(m^2). Pt meets criteria for morbidly obese based on current BMI.  Current diet order is clear liquid, patient is consuming approximately 100% of meals at this time. Labs and medications reviewed. No further nutrition interventions warranted at this time. RD contact information provided. If additional nutrition issues arise, please re-consult RD.  Pt would greatly benefit from outpatient education classes.  Loyce Dys, MS RD LDN Clinical Inpatient Dietitian Pager: [phone] Weekend/After hours pager:  [phone]

## 2011-11-30 NOTE — Progress Notes (Signed)
11/30/11  Diabetes Coordinator consult:  Patient diagnosed with new onset diabetes. HgbA1C is 13.5 %.  CBGs are now less than 180 mg/dl with Novolog MODERATE correction scale AC & HS.  Lantus 10 units started today at 1000.  Would need to see how this effects his blood sugars over a 24 hour period.   In consideration of weight, infection, and CBGs starting at 249 mg/dl yesterday, the recommendation at this time would be to send him home on Metformin and be followed by PCP. Staff nurses can have patient watch DM videos, order Living Well with Diabetes booklet, give patient ExitCare notes on diabetes and teach patient to do own CBGs.  Will need a prescription for blood glucose meter, strips, etc.at discharge.  Dietician has already seen the patient and has given information to follow.  Will need outpatient DM education depending on physician he sees as outpatient.  Smith Mince RN BSN CDE

## 2011-11-30 NOTE — Consult Note (Signed)
[facility] History and Physical  Lawrence Rowland ZOX:[id number] DOB: 1981/01/24 DOA: 11/28/2011  Referring physician: Liz Malady, MD  PCP: [name], MD   Chief Complaint: Hemoglobin A1c of 13.5 HPI:  This is a 31 year old male who was admitted on the 30th for abdominal pain to the surgical service. CT scan showed sigmoid diverticulitis with microperforation. Patient was admitted for IV antibiotics. During this admission he was noted to have elevated CBGs with one random CBG greater than 300. Hemoglobin A1c was checked and was found to be 13.5. Patient does not have any history of diabetes. Currently is well hydrated and asymptomatic from it.     Review of Systems: negative for the following  Constitutional: Denies fever, chills, diaphoresis, appetite change and fatigue.  HEENT: Denies photophobia, eye pain, redness, hearing loss, ear pain, congestion, sore throat, rhinorrhea, sneezing, mouth sores, trouble swallowing, neck pain, neck stiffness and tinnitus.  Respiratory: Denies SOB, DOE, cough, chest tightness, and wheezing.  Cardiovascular: Denies chest pain, palpitations and leg swelling.  Gastrointestinal: Denies nausea, vomiting, abdominal pain, diarrhea, constipation, blood in stool and abdominal distention.  Genitourinary: Denies dysuria, urgency, frequency, hematuria, flank pain and difficulty urinating.  Musculoskeletal: Denies myalgias, back pain, joint swelling, arthralgias and gait problem.  Skin: Denies pallor, rash and wound.  Neurological: Denies dizziness, seizures, syncope, weakness, light-headedness, numbness and headaches.  Hematological: Denies adenopathy. Easy bruising, personal or family bleeding history  Psychiatric/Behavioral: Denies suicidal ideation, mood changes, confusion, nervousness, sleep disturbance and agitation       History reviewed. No pertinent past medical history.   Past Surgical History  Procedure Date  . Fracture  surgery       Social History:  reports that he has never smoked. He does not have any smokeless tobacco history on file. He reports that he does not drink alcohol or use illicit drugs.  No Known Allergies  Family History  Problem Relation Age of Onset  . Colon cancer Mother   . Diabetes Father      Prior to Admission medications   Medication Sig Start Date End Date Taking? Authorizing Provider  polyethylene glycol powder (GLYCOLAX/MIRALAX) powder Take 17 g by mouth daily as needed. For constipation   Yes Historical Provider, MD     Physical Exam: Filed Vitals:   11/29/11 8119 11/29/11 1403 11/29/11 2212 11/30/11 0541  BP: 137/83 133/74 140/79 124/72  Pulse: 89 81 96 84  Temp: 98.9 F (37.2 C) 98.3 F (36.8 C) 99.6 F (37.6 C) 98 F (36.7 C)  TempSrc: Oral Oral Oral Oral  Resp: 18 20 20 20   Height:      Weight:      SpO2: 96% 96% 92% 94%     Constitutional: Vital signs reviewed. Patient is a well-developed and well-nourished in no acute distress and cooperative with exam. Alert and oriented x3.  Head: Normocephalic and atraumatic  Ear: TM normal bilaterally  Mouth: no erythema or exudates, MMM  Eyes: PERRL, EOMI, conjunctivae normal, No scleral icterus.  Neck: Supple, Trachea midline normal ROM, No JVD, mass, thyromegaly, or carotid bruit present.  Cardiovascular: RRR, S1 normal, S2 normal, no MRG, pulses symmetric and intact bilaterally  Pulmonary/Chest: CTAB, no wheezes, rales, or rhonchi  GI: Soft. He exhibits no distension. There is tenderness. There is no rebound and no guarding.  Tenderness lower midline and to the right of lower midline without guarding  Musculoskeletal: No joint deformities, erythema, or stiffness, ROM full and no nontender Ext: no edema and no cyanosis, pulses  palpable bilaterally (DP and PT)  Hematology: no cervical, inginal, or axillary adenopathy.  Neurological: A&O x3, Strenght is normal and symmetric bilaterally, cranial nerve II-XII  are grossly intact, no focal motor deficit, sensory intact to light touch bilaterally.  Skin: Warm, dry and intact. No rash, cyanosis, or clubbing.  Psychiatric: Normal mood and affect. speech and behavior is normal. Judgment and thought content normal. Cognition and memory are normal.       Labs on Admission:    Basic Metabolic Panel:  Lab 11/28/11 1610 11/28/11 0032  NA 133* 132*  K 3.5 3.7  CL 96 96  CO2 26 23  GLUCOSE 249* 277*  BUN 8 8  CREATININE 0.79 0.79  CALCIUM 8.9 9.2  MG -- --  PHOS -- --   Liver Function Tests:  Lab 11/28/11 0032  AST 18  ALT 26  ALKPHOS 49  BILITOT 0.9  PROT 8.0  ALBUMIN 3.5   No results found for this basename: LIPASE:5,AMYLASE:5 in the last 168 hours No results found for this basename: AMMONIA:5 in the last 168 hours CBC:  Lab 11/29/11 0500 11/28/11 0555 11/28/11 0032 11/27/11 2031  WBC 12.0* 12.6* 12.4* 13.1*  NEUTROABS -- -- 8.4* --  HGB 13.7 14.4 15.3 15.8  HCT 39.9 42.1 43.6 51.0  MCV 85.3 85.1 83.5 90.4  PLT 186 194 197 --   Cardiac Enzymes: No results found for this basename: CKTOTAL:5,CKMB:5,CKMBINDEX:5,TROPONINI:5 in the last 168 hours  BNP (last 3 results) No results found for this basename: PROBNP:3 in the last 8760 hours    CBG:  Lab 11/30/11 0820 11/29/11 2208 11/29/11 2014 11/29/11 1715 11/29/11 1129  GLUCAP 181* 167* 165* 217* 217*    Radiological Exams on Admission: No results found.  EKG: Independently reviewed. Stable   Assessment/Plan   1. New onset diabetes with hemoglobin A1c of 13.5, minimum by mouth intake on clear liquid diet therefore the patient will be managed with insulin. He will need insulin teaching. Given his elevated hemoglobin A1c he will need to be discharged on Lantus and pre-meal insulin. Will have diabetes coordinator suggested dosages, verify insurance and affordability. 2. Diverticulitis being managed by surgery 3. Thank you for this consult we'll continue to follow  along  Code Status:   full Family Communication: bedside Disposition Plan: admit   Time spent: 70 mins   [facility] Pager [phone]  If 7PM-7AM, please contact night-coverage [email or web address] Password [facility] 11/30/2011, 9:36 AM

## 2011-11-30 NOTE — Progress Notes (Signed)
Patient ID: Lawrence Rowland, male   DOB: 02-14-1980, 31 y.o.   MRN: [id number]    Subjective: Pt feeling much better this morning.  Abdominal pain much improved.  No nausea.  Objective: Vital signs in last 24 hours: Temp:  [98 F (36.7 C)-99.6 F (37.6 C)] 98 F (36.7 C) (11/01 0541) Pulse Rate:  [81-96] 84  (11/01 0541) Resp:  [20] 20  (11/01 0541) BP: (124-140)/(72-79) 124/72 mmHg (11/01 0541) SpO2:  [92 %-96 %] 94 % (11/01 0541) Last BM Date: 11/29/11  Intake/Output from previous day: 10/31 0701 - 11/01 0700 In: 3257.6 [I.V.:2557.6; IV Piggyback:700] Out: -  Intake/Output this shift:    PE: Abd: soft, less tender on the right side, +BS, ND, obese Heart: regular Lungs: CTAB  Lab Results:   Basename 11/29/11 0500 11/28/11 0555  WBC 12.0* 12.6*  HGB 13.7 14.4  HCT 39.9 42.1  PLT 186 194   BMET  Basename 11/28/11 0555 11/28/11 0032  NA 133* 132*  K 3.5 3.7  CL 96 96  CO2 26 23  GLUCOSE 249* 277*  BUN 8 8  CREATININE 0.79 0.79  CALCIUM 8.9 9.2   PT/INR No results found for this basename: LABPROT:2,INR:2 in the last 72 hours CMP     Component Value Date/Time   NA 133* 11/28/2011 0555   K 3.5 11/28/2011 0555   CL 96 11/28/2011 0555   CO2 26 11/28/2011 0555   GLUCOSE 249* 11/28/2011 0555   BUN 8 11/28/2011 0555   CREATININE 0.79 11/28/2011 0555   CALCIUM 8.9 11/28/2011 0555   PROT 8.0 11/28/2011 0032   ALBUMIN 3.5 11/28/2011 0032   AST 18 11/28/2011 0032   ALT 26 11/28/2011 0032   ALKPHOS 49 11/28/2011 0032   BILITOT 0.9 11/28/2011 0032   GFRNONAA >90 11/28/2011 0555   GFRAA >90 11/28/2011 0555   Lipase  No results found for this basename: lipase       Studies/Results: No results found.  Anti-infectives: Anti-infectives     Start     Dose/Rate Route Frequency Ordered Stop   11/28/11 0800   metroNIDAZOLE (FLAGYL) IVPB 500 mg        500 mg 100 mL/hr over 60 Minutes Intravenous Every 8 hours 11/28/11 0342     11/28/11 0400    ciprofloxacin (CIPRO) IVPB 400 mg        400 mg 200 mL/hr over 60 Minutes Intravenous Every 12 hours 11/28/11 0342     11/28/11 0045   metroNIDAZOLE (FLAGYL) IVPB 500 mg        500 mg 100 mL/hr over 60 Minutes Intravenous  Once 11/28/11 0032 11/28/11 0249   11/28/11 0045   ciprofloxacin (CIPRO) IVPB 400 mg        400 mg 200 mL/hr over 60 Minutes Intravenous  Once 11/28/11 0032 11/28/11 0353           Assessment/Plan  1. Diverticulitis with microperf, no abscess 2. New onset DM 3. Morbid obesity  Plan: 1. Will start patient on clear liquids 2. HGBA1C is 13.5.  I have contacted internal medicine as well as the dietician to assist the patient with management of new onset DM.  I have also d/w him that he will need to get a PCP once he gets out of here to manage this problem. 3. Cont abx therapy, C/F.   LOS: 2 days    French Kendra E 11/30/2011, 8:35 AM Pager: [phone]

## 2011-11-30 NOTE — Progress Notes (Signed)
Pt with decreased pain.  Trial Clears. IM consult for elev HgbA1C -con't Abx

## 2011-12-01 DIAGNOSIS — E119 Type 2 diabetes mellitus without complications: Secondary | ICD-10-CM | POA: Diagnosis present

## 2011-12-01 DIAGNOSIS — K5720 Diverticulitis of large intestine with perforation and abscess without bleeding: Secondary | ICD-10-CM | POA: Diagnosis present

## 2011-12-01 DIAGNOSIS — I10 Essential (primary) hypertension: Secondary | ICD-10-CM

## 2011-12-01 LAB — GLUCOSE, CAPILLARY
Glucose-Capillary: 179 mg/dL — ABNORMAL HIGH (ref 70–99)
Glucose-Capillary: 197 mg/dL — ABNORMAL HIGH (ref 70–99)
Glucose-Capillary: 182 mg/dL — ABNORMAL HIGH (ref 70–99)

## 2011-12-01 LAB — BASIC METABOLIC PANEL
CO2: 24 mEq/L (ref 19–32)
Chloride: 102 mEq/L (ref 96–112)
GFR calc non Af Amer: >90 mL/min (ref >90)
Glucose, Bld: 188 mg/dL — ABNORMAL HIGH (ref 70–99)
Potassium: 3.5 mEq/L (ref 3.5–5.1)
Sodium: 136 mEq/L (ref 135–145)

## 2011-12-01 LAB — CBC
Hemoglobin: 13.6 g/dL (ref 13.0–17.0)
RBC: 4.69 MIL/uL (ref 4.22–5.81)
WBC: 8.1 10*3/uL (ref 4.0–10.5)

## 2011-12-01 MED ORDER — INSULIN GLARGINE 100 UNIT/ML ~~LOC~~ SOLN
20.0000 [IU] | Freq: Every day | SUBCUTANEOUS | Status: DC
  Administered 2011-12-01: 20 [IU] via SUBCUTANEOUS

## 2011-12-01 MED ORDER — SODIUM CHLORIDE 0.9 % IJ SOLN: 3.0000 mL | Freq: Two times a day (BID) | INTRAMUSCULAR | Status: DC

## 2011-12-01 MED ORDER — ALUM & MAG HYDROXIDE-SIMETH 200-200-20 MG/5ML PO SUSP: 30.0000 mL | Freq: Four times a day (QID) | ORAL | Status: DC | PRN

## 2011-12-01 MED ORDER — LIP MEDEX EX OINT
1.0000 "application " | TOPICAL_OINTMENT | Freq: Two times a day (BID) | CUTANEOUS | Status: DC
  Administered 2011-12-01: 1 via TOPICAL
  Filled 2011-12-01: qty 7

## 2011-12-01 MED ORDER — ACETAMINOPHEN 500 MG PO TABS
1000.0000 mg | ORAL_TABLET | Freq: Three times a day (TID) | ORAL | Status: DC
  Administered 2011-12-01 – 2011-12-02 (×4): 1000 mg via ORAL
  Filled 2011-12-01 (×6): qty 2

## 2011-12-01 MED ORDER — METRONIDAZOLE IN NACL 5-0.79 MG/ML-% IV SOLN
500.0000 mg | Freq: Four times a day (QID) | INTRAVENOUS | Status: DC
  Administered 2011-12-01 – 2011-12-02 (×5): 500 mg via INTRAVENOUS
  Filled 2011-12-01 (×6): qty 100

## 2011-12-01 MED ORDER — INSULIN ASPART 100 UNIT/ML ~~LOC~~ SOLN
0.0000 [IU] | Freq: Three times a day (TID) | SUBCUTANEOUS | Status: DC
  Administered 2011-12-02: 3 [IU] via SUBCUTANEOUS

## 2011-12-01 MED ORDER — MAGIC MOUTHWASH
15.0000 mL | Freq: Four times a day (QID) | ORAL | Status: DC | PRN
  Filled 2011-12-01: qty 15

## 2011-12-01 MED ORDER — SODIUM CHLORIDE 0.9 % IV SOLN: 250.0000 mL | INTRAVENOUS | Status: DC | PRN

## 2011-12-01 MED ORDER — ENOXAPARIN SODIUM 80 MG/0.8ML ~~LOC~~ SOLN
0.5000 mg/kg | SUBCUTANEOUS | Status: DC
  Administered 2011-12-02: 80 mg via SUBCUTANEOUS
  Filled 2011-12-01: qty 0.8

## 2011-12-01 MED ORDER — MAGNESIUM HYDROXIDE 400 MG/5ML PO SUSP: 30.0000 mL | Freq: Two times a day (BID) | ORAL | Status: DC | PRN

## 2011-12-01 MED ORDER — SODIUM CHLORIDE 0.9 % IJ SOLN: 3.0000 mL | INTRAMUSCULAR | Status: DC | PRN

## 2011-12-01 MED ORDER — METOPROLOL TARTRATE 12.5 MG HALF TABLET
12.5000 mg | ORAL_TABLET | Freq: Two times a day (BID) | ORAL | Status: DC
  Administered 2011-12-01 – 2011-12-02 (×3): 12.5 mg via ORAL
  Filled 2011-12-01 (×4): qty 1

## 2011-12-01 NOTE — Progress Notes (Signed)
Encouraged pt to watch diabetic again while in the hospital.  Pt demonstrated how to administer SQ Insulin injection with good technique.

## 2011-12-01 NOTE — Progress Notes (Signed)
Lawrence Rowland [id number] 10-Jul-1980   Subjective:  Feeling better Loose BMs Pain down Tol clears PO  Objective:  Vital signs:  Filed Vitals:   11/30/11 0541 11/30/11 1449 11/30/11 2132 12/01/11 0614  BP: 124/72 133/74 142/70 132/93  Pulse: 84 65 80 82  Temp: 98 F (36.7 C) 98.7 F (37.1 C) 98.8 F (37.1 C) 98.9 F (37.2 C)  TempSrc: Oral Oral Oral Oral  Resp: 20 18 18 20   Height:      Weight:      SpO2: 94% 100% 95% 94%    Last BM Date: 11/30/11  Intake/Output   Yesterday:  11/01 0701 - 11/02 0700 In: 1845 [P.O.:480; I.V.:1365] Out: -  This shift:     Bowel function:  Flatus: y  BM: loose x3  Physical Exam:  General: Pt awake/alert/oriented x4 in no acute distress Eyes: PERRL, normal EOM.  Sclera clear.  No icterus Neuro: CN II-XII intact w/o focal sensory/motor deficits. Lymph: No head/neck/groin lymphadenopathy Psych:  No delerium/psychosis/paranoia HENT: Normocephalic, Mucus membranes moist.  No thrush Neck: Supple, No tracheal deviation Chest: No chest wall pain w good excursion CV:  Pulses intact.  Regular rhythm Abdomen: Soft.  Nondistended.  Mildly tender at suprapubic region only.  No peritonitis.  No incarcerated hernias. Ext:  SCDs BLE.  No mjr edema.  No cyanosis Skin: No petechiae / purpurae  Problem List:  Principal Problem:  *Diverticulitis of colon with perforation Active Problems:  DM (diabetes mellitus)  HTN (hypertension)  Obesity, Class III, BMI 40-49.9 (morbid obesity)   Assessment  Lawrence Rowland  31 y.o. male       Improving  Plan:  -Adv diet -bowel regimen later -HTN - add B blocker low dose -DM control - HgbA1C 13 !!  -VTE prophylaxis- SCDs, etc -mobilize as tolerated to help recovery  Ardeth Sportsman, M.D., F.A.C.S. [facility] [address], [address] [place], [place] [address] [phone] Main / Paging [phone] Voice Mail   12/01/2011  CARE TEAM:  PCP: [name], MD  Outpatient Care Team: Patient Care Team: Provider Default, MD as PCP - General  Inpatient Treatment Team: Treatment Team: Attending Provider: Md Montez Morita, MD; Rounding Team: Md Montez Morita, MD; Technician: Lowell Bouton, NT; Registered Nurse: Costella Hatcher, RN; Technician: Star Age, NT; Rounding Team: Mahala Menghini, MD; Registered Nurse: Launa Flight, RN   Results:   Labs: Results for orders placed during the hospital encounter of 11/28/11 (from the past 48 hour(s))  GLUCOSE, CAPILLARY     Status: Abnormal   Collection Time   11/29/11  8:23 AM      Component Value Range Comment   Glucose-Capillary 240 (*) 70 - 99 mg/dL    Comment 1 Notify RN      Comment 2 Documented in Chart     HEMOGLOBIN A1C     Status: Abnormal   Collection Time   11/29/11  9:06 AM      Component Value Range Comment   Hemoglobin A1C 13.5 (*) <5.7 %    Mean Plasma Glucose 341 (*) <117 mg/dL   GLUCOSE, CAPILLARY     Status: Abnormal   Collection Time   11/29/11 11:29 AM      Component Value Range Comment   Glucose-Capillary 217 (*) 70 - 99 mg/dL   GLUCOSE, CAPILLARY     Status: Abnormal   Collection Time   11/29/11  5:15 PM      Component  Value Range Comment   Glucose-Capillary 217 (*) 70 - 99 mg/dL   GLUCOSE, CAPILLARY     Status: Abnormal   Collection Time   11/29/11  8:14 PM      Component Value Range Comment   Glucose-Capillary 165 (*) 70 - 99 mg/dL   GLUCOSE, CAPILLARY     Status: Abnormal   Collection Time   11/29/11 10:08 PM      Component Value Range Comment   Glucose-Capillary 167 (*) 70 - 99 mg/dL   GLUCOSE, CAPILLARY     Status: Abnormal   Collection Time   11/30/11  8:20 AM      Component Value Range Comment   Glucose-Capillary 181 (*) 70 - 99 mg/dL    Comment 1 Documented in Chart      Comment 2 Notify RN     GLUCOSE, CAPILLARY     Status: Abnormal   Collection Time   11/30/11 12:07 PM      Component  Value Range Comment   Glucose-Capillary 178 (*) 70 - 99 mg/dL   GLUCOSE, CAPILLARY     Status: Abnormal   Collection Time   11/30/11  5:18 PM      Component Value Range Comment   Glucose-Capillary 269 (*) 70 - 99 mg/dL    Comment 1 Notify RN     GLUCOSE, CAPILLARY     Status: Abnormal   Collection Time   11/30/11  8:02 PM      Component Value Range Comment   Glucose-Capillary 199 (*) 70 - 99 mg/dL   GLUCOSE, CAPILLARY     Status: Abnormal   Collection Time   12/01/11 12:00 AM      Component Value Range Comment   Glucose-Capillary 163 (*) 70 - 99 mg/dL   GLUCOSE, CAPILLARY     Status: Abnormal   Collection Time   12/01/11  3:58 AM      Component Value Range Comment   Glucose-Capillary 179 (*) 70 - 99 mg/dL   CBC     Status: Normal   Collection Time   12/01/11  7:10 AM      Component Value Range Comment   WBC 8.1  4.0 - 10.5 K/uL    RBC 4.69  4.22 - 5.81 MIL/uL    Hemoglobin 13.6  13.0 - 17.0 g/dL    HCT 11.9  14.7 - 82.9 %    MCV 84.2  78.0 - 100.0 fL    MCH 29.0  26.0 - 34.0 pg    MCHC 34.4  30.0 - 36.0 g/dL    RDW 56.2  13.0 - 86.5 %    Platelets 222  150 - 400 K/uL     Imaging / Studies: No results found.  Medications / Allergies: per chart  Antibiotics: Anti-infectives     Start     Dose/Rate Route Frequency Ordered Stop   11/28/11 0800   metroNIDAZOLE (FLAGYL) IVPB 500 mg        500 mg 100 mL/hr over 60 Minutes Intravenous Every 8 hours 11/28/11 0342     11/28/11 0400   ciprofloxacin (CIPRO) IVPB 400 mg        400 mg 200 mL/hr over 60 Minutes Intravenous Every 12 hours 11/28/11 0342     11/28/11 0045   metroNIDAZOLE (FLAGYL) IVPB 500 mg        500 mg 100 mL/hr over 60 Minutes Intravenous  Once 11/28/11 0032 11/28/11 0249   11/28/11 0045   ciprofloxacin (CIPRO) IVPB 400  mg        400 mg 200 mL/hr over 60 Minutes Intravenous  Once 11/28/11 0032 11/28/11 0353

## 2011-12-01 NOTE — Progress Notes (Signed)
[facility] PROGRESS NOTE  Lawrence Rowland ZOX:[id number] DOB: 1980-06-11 DOA: 11/28/2011 PCP: [name], MD  Assessment/Plan: Principal Problem:  *Diverticulitis of colon with perforation Active Problems:  DM (diabetes mellitus)  HTN (hypertension)  Obesity, Class III, BMI 40-49.9 (morbid obesity)    Diabetes mellitus, hemoglobin A1c of 13.5, increase Lantus to 20 units as advancing diet, continue sliding scale insulin  Diverticulitis, per surgery  HTN  continue beta blocker, started metoprolol    Code Status: full Family Communication: family updated about patient's clinical progress Disposition Plan:  As above    Brief narrative: This is a 31 year old male who was admitted on the 30th for abdominal pain to the surgical service. CT scan showed sigmoid diverticulitis with microperforation. Patient was admitted for IV antibiotics. During this admission he was noted to have elevated CBGs with one random CBG greater than 300.  Hemoglobin A1c was checked and was found to be 13.5. Patient does not have any history of diabetes. Currently is well hydrated and asymptomatic from it. Was on metformin at 1 point   Consultants:  None  Procedures:  None  Antibiotics:  None  HPI/Subjective: Pleasant, CBG is improved overnight, advancing diet  Objective: Filed Vitals:   11/30/11 0541 11/30/11 1449 11/30/11 2132 12/01/11 0614  BP: 124/72 133/74 142/70 132/93  Pulse: 84 65 80 82  Temp: 98 F (36.7 C) 98.7 F (37.1 C) 98.8 F (37.1 C) 98.9 F (37.2 C)  TempSrc: Oral Oral Oral Oral  Resp: 20 18 18 20   Height:      Weight:      SpO2: 94% 100% 95% 94%    Intake/Output Summary (Last 24 hours) at 12/01/11 0916 Last data filed at 12/01/11 0600  Gross per 24 hour  Intake   1845 ml  Output      0 ml  Net   1845 ml    Exam:  General: Pt awake/alert/oriented x4 in no acute distress  Eyes: PERRL, normal EOM. Sclera clear. No icterus  Neuro: CN II-XII  intact w/o focal sensory/motor deficits.  Lymph: No head/neck/groin lymphadenopathy  Psych: No delerium/psychosis/paranoia  HENT: Normocephalic, Mucus membranes moist. No thrush  Neck: Supple, No tracheal deviation  Chest: No chest wall pain w good excursion  CV: Pulses intact. Regular rhythm  Abdomen: Soft. Nondistended. Mildly tender at suprapubic region only. No peritonitis. No incarcerated hernias.  Ext: SCDs BLE. No mjr edema. No cyanosis  Skin: No petechiae / purpurae     Data Reviewed: Basic Metabolic Panel:  Lab 12/01/11 8119 11/28/11 0555 11/28/11 0032  NA 136 133* 132*  K 3.5 3.5 3.7  CL 102 96 96  CO2 24 26 23   GLUCOSE 188* 249* 277*  BUN 7 8 8   CREATININE 0.74 0.79 0.79  CALCIUM 8.9 8.9 9.2  MG -- -- --  PHOS -- -- --    Liver Function Tests:  Lab 11/28/11 0032  AST 18  ALT 26  ALKPHOS 49  BILITOT 0.9  PROT 8.0  ALBUMIN 3.5   No results found for this basename: LIPASE:5,AMYLASE:5 in the last 168 hours No results found for this basename: AMMONIA:5 in the last 168 hours  CBC:  Lab 12/01/11 0710 11/29/11 0500 11/28/11 0555 11/28/11 0032 11/27/11 2031  WBC 8.1 12.0* 12.6* 12.4* 13.1*  NEUTROABS -- -- -- 8.4* --  HGB 13.6 13.7 14.4 15.3 15.8  HCT 39.5 39.9 42.1 43.6 51.0  MCV 84.2 85.3 85.1 83.5 90.4  PLT 222 186 194 197 --    Cardiac  Enzymes: No results found for this basename: CKTOTAL:5,CKMB:5,CKMBINDEX:5,TROPONINI:5 in the last 168 hours BNP (last 3 results) No results found for this basename: PROBNP:3 in the last 8760 hours   CBG:  Lab 12/01/11 0819 12/01/11 0358 12/01/11 11/30/11 2002 11/30/11 1718  GLUCAP 191* 179* 163* 199* 269*    No results found for this or any previous visit (from the past 240 hour(s)).   Studies: Ct Abdomen Pelvis W Contrast  11/27/2011  *RADIOLOGY REPORT*  Clinical Data: Leukocytosis.  Right lower quadrant pain.  CT ABDOMEN AND PELVIS WITH CONTRAST  Technique:  Multidetector CT imaging of the abdomen and pelvis  was performed following the standard protocol during bolus administration of intravenous contrast.  Contrast: OMNIPAQUE IOHEXOL 300 MG/ML  SOLN  Comparison: None.  Findings: The lung bases are clear without focal nodule, mass, or airspace disease.  The heart size is normal.  No significant pleural or pericardial effusion is present.  There is diffuse fatty infiltration of the liver.  No focal hepatic lesions are present.  The spleen is within normal limits.  The stomach, duodenum, pancreas are unremarkable.  The common bile duct and gallbladder are normal.  The adrenal glands are normal bilaterally.  The kidneys and ureters are unremarkable.  Diffuse inflammatory changes are present to about the sigmoid colon at a point where it sweeps to the right, just below the appendix. A small amount of free air is present, compatible with a focal perforation.  This may be contained.  There is no discrete abscess. The more proximal colon is remarkable for mild secondary inflammation of the cecum adjacent to the inflamed area. The remainder of the colon is unremarkable.  There is mild focal inflammation of a loop of small bowel extending past the inflamed sigmoid colon.  The appendix is visualized and within normal limits.  The small bowel is within normal limits.  No significant adenopathy is present.  The urinary bladder is within normal limits.  The bone windows are unremarkable.  IMPRESSION:  1.  Sigmoid diverticulitis. 2.  A small amount of free air is compatible with a focal perforation, likely contained without an abscess. 3.  Secondary inflammation of at least one loop of small bowel adjacent to the diverticulitis. 4.  Hepatic steatosis.   Original Report Authenticated By: [name]. MATTERN, M.D.     Scheduled Meds:   . acetaminophen  1,000 mg Oral TID  . ciprofloxacin  400 mg Intravenous Q12H  . enoxaparin  40 mg Subcutaneous Q24H  . insulin aspart  0-15 Units Subcutaneous Q4H  . insulin glargine  20  Units Subcutaneous QHS  . lip balm  1 application Topical BID  . living well with diabetes book   Does not apply Once  . metoprolol tartrate  12.5 mg Oral BID  . metronidazole  500 mg Intravenous Q6H  . pantoprazole (PROTONIX) IV  40 mg Intravenous QHS  . sodium chloride  3 mL Intravenous Q12H  . DISCONTD: insulin aspart  0-15 Units Subcutaneous TID WC  . DISCONTD: insulin aspart  0-20 Units Subcutaneous TID WC  . DISCONTD: insulin glargine  10 Units Subcutaneous QHS  . DISCONTD: metronidazole  500 mg Intravenous Q8H   Continuous Infusions:   . 0.9 % NaCl with KCl 20 mEq / L 50 mL/hr at 12/01/11 1610    Principal Problem:  *Diverticulitis of colon with perforation Active Problems:  DM (diabetes mellitus)  HTN (hypertension)  Obesity, Class III, BMI 40-49.9 (morbid obesity)    Time  spent: 40 minutes   [facility]  [facility] Pager [phone]. If 8PM-8AM, please contact night-coverage at [email or web address], password [facility] 12/01/2011, 9:16 AM  LOS: 3 days

## 2011-12-02 DIAGNOSIS — K760 Fatty (change of) liver, not elsewhere classified: Secondary | ICD-10-CM | POA: Diagnosis present

## 2011-12-02 LAB — GLUCOSE, CAPILLARY: Glucose-Capillary: 178 mg/dL — ABNORMAL HIGH (ref 70–99)

## 2011-12-02 MED ORDER — INSULIN GLARGINE 100 UNIT/ML ~~LOC~~ SOLN: 25.0000 [IU] | Freq: Every day | SUBCUTANEOUS | Status: DC

## 2011-12-02 MED ORDER — PANTOPRAZOLE SODIUM 40 MG PO TBEC: 40.0000 mg | DELAYED_RELEASE_TABLET | Freq: Every day | ORAL | Status: DC

## 2011-12-02 MED ORDER — GLIPIZIDE 2.5 MG HALF TABLET
2.5000 mg | ORAL_TABLET | Freq: Two times a day (BID) | ORAL | Status: DC
  Filled 2011-12-02 (×2): qty 1

## 2011-12-02 MED ORDER — METOPROLOL TARTRATE 12.5 MG HALF TABLET: 25.0000 mg | ORAL_TABLET | Freq: Every day | ORAL | Status: DC

## 2011-12-02 MED ORDER — GLIPIZIDE 2.5 MG HALF TABLET: 2.5000 mg | ORAL_TABLET | Freq: Two times a day (BID) | ORAL | Status: DC

## 2011-12-02 MED ORDER — METRONIDAZOLE 500 MG PO TABS: 500.0000 mg | ORAL_TABLET | Freq: Three times a day (TID) | ORAL | Status: DC

## 2011-12-02 MED ORDER — CIPROFLOXACIN HCL 500 MG PO TABS: 500.0000 mg | ORAL_TABLET | Freq: Two times a day (BID) | ORAL | Status: DC

## 2011-12-02 MED ORDER — INSULIN ASPART 100 UNIT/ML ~~LOC~~ SOLN
0.0000 [IU] | Freq: Three times a day (TID) | SUBCUTANEOUS | Status: DC
  Administered 2011-12-02: 5 [IU] via SUBCUTANEOUS

## 2011-12-02 MED ORDER — GLUCOSE BLOOD VI STRP: ORAL_STRIP | Status: AC

## 2011-12-02 MED ORDER — "INSULIN SYRINGE 29G X 1/2"" 0.5 ML MISC": Status: AC

## 2011-12-02 NOTE — Progress Notes (Signed)
Pt understand the importance of establishing a primary MD to manage diabetes.  He verbalized understanding of Insulin administration, use of glucose meters and reinforced signs  Of hypo and hyperglycemia.

## 2011-12-02 NOTE — Discharge Summary (Addendum)
Physician Discharge Summary  Patient ID: Lawrence Rowland MRN: [id number] DOB/AGE: 1980/11/27 31 y.o.  Admit date: 11/28/2011 Discharge date: 12/02/2011  Patient Care Team: Phillips Odor, MD as PCP - General (Family Medicine) Axel Filler, MD as Consulting Physician (Surgery)   Admission Diagnoses: Principal Problem:  *Diverticulitis of colon with perforation Active Problems:  DM (diabetes mellitus)  HTN (hypertension)  Obesity, Class III, BMI 40-49.9 (morbid obesity)  Discharge Diagnoses:  Principal Problem:  *Diverticulitis of colon with perforation Active Problems:  DM (diabetes mellitus)  HTN (hypertension)  Obesity, Class III, BMI 40-49.9 (morbid obesity)  Hepatic steatosis   Discharged Condition: good  Hospital Course:   Pt with abd pain & Dx'd with diverticulitis with small perforation, DM, HTN.  Admitted on IV ABx.  IM consult to help with DM control.  B blocker added in hospital.  Pain decreased gradually.  The patient mobilized and advanced to a solid diet gradually.  Pain was well-controlled and transitioned off IV medications.    By the time of discharge, the patient was walking well the hallways, eating food well, having flatus.  Pain was-controlled on an oral regimen.  Based on meeting DC criteria and recovering well, I felt it was safe for the patient to be discharged home with close followup.  Instructions were discussed in detail.  They are written as well.  Consults: Internal Medicine  Significant Diagnostic Studies:   Results for CAIN, PANTHER (MRN [id number]) as of 12/02/2011 10:21  Ref. Range 11/29/2011 09:06  Hemoglobin A1C Latest Range: <5.7 % 13.5 (H)    Clinical Data: Leukocytosis. Right lower quadrant pain.  CT ABDOMEN AND PELVIS WITH CONTRAST  Technique: Multidetector CT imaging of the abdomen and pelvis was  performed following the standard protocol during bolus  administration of intravenous contrast.  Contrast:  OMNIPAQUE IOHEXOL 300 MG/ML SOLN  Comparison: None.  Findings: The lung bases are clear without focal nodule, mass, or  airspace disease. The heart size is normal. No significant  pleural or pericardial effusion is present.  There is diffuse fatty infiltration of the liver. No focal hepatic  lesions are present. The spleen is within normal limits. The  stomach, duodenum, pancreas are unremarkable. The common bile duct  and gallbladder are normal. The adrenal glands are normal  bilaterally. The kidneys and ureters are unremarkable.  Diffuse inflammatory changes are present to about the sigmoid colon  at a point where it sweeps to the right, just below the appendix.  A small amount of free air is present, compatible with a focal  perforation. This may be contained. There is no discrete abscess.  The more proximal colon is remarkable for mild secondary  inflammation of the cecum adjacent to the inflamed area. The  remainder of the colon is unremarkable. There is mild focal  inflammation of a loop of small bowel extending past the inflamed  sigmoid colon. The appendix is visualized and within normal  limits. The small bowel is within normal limits. No significant  adenopathy is present. The urinary bladder is within normal  limits.  The bone windows are unremarkable.  IMPRESSION:  1. Sigmoid diverticulitis.  2. A small amount of free air is compatible with a focal  perforation, likely contained without an abscess.  3. Secondary inflammation of at least one loop of small bowel  adjacent to the diverticulitis.  4. Hepatic steatosis.  Original Report Authenticated By: [name]. MATTERN, M.D.   Results for orders placed during the hospital encounter of 11/28/11 (from the  past 72 hour(s))  GLUCOSE, CAPILLARY     Status: Abnormal   Collection Time   11/29/11 11:29 AM      Component Value Range Comment   Glucose-Capillary 217 (*) 70 - 99 mg/dL   GLUCOSE, CAPILLARY     Status:  Abnormal   Collection Time   11/29/11  5:15 PM      Component Value Range Comment   Glucose-Capillary 217 (*) 70 - 99 mg/dL   GLUCOSE, CAPILLARY     Status: Abnormal   Collection Time   11/29/11  8:14 PM      Component Value Range Comment   Glucose-Capillary 165 (*) 70 - 99 mg/dL   GLUCOSE, CAPILLARY     Status: Abnormal   Collection Time   11/29/11 10:08 PM      Component Value Range Comment   Glucose-Capillary 167 (*) 70 - 99 mg/dL   GLUCOSE, CAPILLARY     Status: Abnormal   Collection Time   11/30/11  8:20 AM      Component Value Range Comment   Glucose-Capillary 181 (*) 70 - 99 mg/dL    Comment 1 Documented in Chart      Comment 2 Notify RN     GLUCOSE, CAPILLARY     Status: Abnormal   Collection Time   11/30/11 12:07 PM      Component Value Range Comment   Glucose-Capillary 178 (*) 70 - 99 mg/dL   GLUCOSE, CAPILLARY     Status: Abnormal   Collection Time   11/30/11  5:18 PM      Component Value Range Comment   Glucose-Capillary 269 (*) 70 - 99 mg/dL    Comment 1 Notify RN     GLUCOSE, CAPILLARY     Status: Abnormal   Collection Time   11/30/11  8:02 PM      Component Value Range Comment   Glucose-Capillary 199 (*) 70 - 99 mg/dL   GLUCOSE, CAPILLARY     Status: Abnormal   Collection Time   12/01/11 12:00 AM      Component Value Range Comment   Glucose-Capillary 163 (*) 70 - 99 mg/dL   GLUCOSE, CAPILLARY     Status: Abnormal   Collection Time   12/01/11  3:58 AM      Component Value Range Comment   Glucose-Capillary 179 (*) 70 - 99 mg/dL   CBC     Status: Normal   Collection Time   12/01/11  7:10 AM      Component Value Range Comment   WBC 8.1  4.0 - 10.5 K/uL    RBC 4.69  4.22 - 5.81 MIL/uL    Hemoglobin 13.6  13.0 - 17.0 g/dL    HCT 16.1  09.6 - 04.5 %    MCV 84.2  78.0 - 100.0 fL    MCH 29.0  26.0 - 34.0 pg    MCHC 34.4  30.0 - 36.0 g/dL    RDW 40.9  81.1 - 91.4 %    Platelets 222  150 - 400 K/uL   BASIC METABOLIC PANEL     Status: Abnormal   Collection  Time   12/01/11  7:10 AM      Component Value Range Comment   Sodium 136  135 - 145 mEq/L    Potassium 3.5  3.5 - 5.1 mEq/L    Chloride 102  96 - 112 mEq/L    CO2 24  19 - 32 mEq/L    Glucose, Bld 188 (*)  70 - 99 mg/dL    BUN 7  6 - 23 mg/dL    Creatinine, Ser 4.54  0.50 - 1.35 mg/dL    Calcium 8.9  8.4 - 09.8 mg/dL    GFR calc non Af Amer >90  >90 mL/min    GFR calc Af Amer >90  >90 mL/min   GLUCOSE, CAPILLARY     Status: Abnormal   Collection Time   12/01/11  8:19 AM      Component Value Range Comment   Glucose-Capillary 191 (*) 70 - 99 mg/dL    Comment 1 Documented in Chart      Comment 2 Notify RN     GLUCOSE, CAPILLARY     Status: Abnormal   Collection Time   12/01/11 11:58 AM      Component Value Range Comment   Glucose-Capillary 184 (*) 70 - 99 mg/dL    Comment 1 Notify RN     GLUCOSE, CAPILLARY     Status: Abnormal   Collection Time   12/01/11  4:12 PM      Component Value Range Comment   Glucose-Capillary 197 (*) 70 - 99 mg/dL    Comment 1 Notify RN     GLUCOSE, CAPILLARY     Status: Abnormal   Collection Time   12/01/11  8:52 PM      Component Value Range Comment   Glucose-Capillary 182 (*) 70 - 99 mg/dL    Comment 1 Notify RN      Comment 2 Documented in Chart        Treatments: antibiotics: Cipro and metronidazole  Discharge Exam: Blood pressure 163/96, pulse 73, temperature 98.7 F (37.1 C), temperature source Oral, resp. rate 19, height 6\' 1"  (1.854 m), weight 358 lb 14.5 oz (162.8 kg), SpO2 99.00%.  General: Pt awake/alert/oriented x4 in no acute distress  Eyes: PERRL, normal EOM. Sclera clear. No icterus  Neuro: CN II-XII intact w/o focal sensory/motor deficits.  Lymph: No head/neck/groin lymphadenopathy  Psych: No delerium/psychosis/paranoia  HENT: Normocephalic, Mucus membranes moist. No thrush  Neck: Supple, No tracheal deviation  Chest: No chest wall pain w good excursion  CV: Pulses intact. Regular rhythm  Abdomen: Soft/Obese. Nondistended.  Nontender. No peritonitis. No incarcerated hernias.  Ext: SCDs BLE. No mjr edema. No cyanosis  Skin: No petechiae / purpurae   Disposition: ED Dismiss - Diverted Elsewhere  Discharge Orders    Future Orders Please Complete By Expires   Diet - low sodium heart healthy      Increase activity slowly      Call MD for:  persistant nausea and vomiting      Call MD for:  severe uncontrolled pain      Call MD for:  extreme fatigue      Call MD for:      Comments:   Temp >101.5 F       Medication List     As of 12/02/2011 10:31 AM    STOP taking these medications         polyethylene glycol powder powder   Commonly known as: GLYCOLAX/MIRALAX      TAKE these medications         ciprofloxacin 500 MG tablet   Commonly known as: CIPRO   Take 1 tablet (500 mg total) by mouth 2 (two) times daily.      glipiZIDE 2.5 mg Tabs   Commonly known as: GLUCOTROL   Take 0.5 tablets (2.5 mg total) by mouth 2 (two) times  daily before a meal.      glucose blood test strip   Use as instructed      insulin glargine 100 UNIT/ML injection   Commonly known as: LANTUS   Inject 25 Units into the skin at bedtime.      INSULIN SYRINGE .5CC/29G 29G X 1/2" 0.5 ML Misc   Dispense 150 syringes      metoprolol tartrate 12.5 mg Tabs   Commonly known as: LOPRESSOR   Take 1 tablet (25 mg total) by mouth daily.      metroNIDAZOLE 500 MG tablet   Commonly known as: FLAGYL   Take 1 tablet (500 mg total) by mouth 3 (three) times daily.        Follow-up Information    Follow up with Lajean Saver, MD. In 2 weeks.   Contact information:   [address] [place] [address] [phone]       Follow up with Carmelina Dane, MD. In 2 weeks. (Set up appointment with a Primary Care Doctor to help manage health issues, especially Diabetes)    Contact information:   [address] [place] [address] [phone]          Signed: Ardeth Sportsman. 12/02/2011, 10:19 AM

## 2011-12-02 NOTE — Progress Notes (Signed)
[facility] PROGRESS NOTE  Blaize Epple ZOX:[id number] DOB: 02-04-1980 DOA: 11/28/2011 PCP: [name], MD  Assessment/Plan: Principal Problem:  *Diverticulitis of colon with perforation Active Problems:  DM (diabetes mellitus)  HTN (hypertension)  Obesity, Class III, BMI 40-49.9 (morbid obesity)  Diabetes mellitus, hemoglobin A1c of 13.5, increase Lantus to 25 units as advancing diet, patient was on metformin several years ago, will start glipizide 2.5 mg twice a day, discharge home on glipizide and Lantus, prescriptions provided continue sliding scale insulin as well while the patient is hospitalized  Diverticulitis, per surgery   HTN continue beta blocker, started metoprolol   Code Status: full  Family Communication: family updated about patient's clinical progress  Disposition Plan: As above   Brief narrative:  This is a 31 year old male who was admitted on the 30th for abdominal pain to the surgical service. CT scan showed sigmoid diverticulitis with microperforation. Patient was admitted for IV antibiotics. During this admission he was noted to have elevated CBGs with one random CBG greater than 300.  Hemoglobin A1c was checked and was found to be 13.5. Patient does not have any history of diabetes. Currently is well hydrated and asymptomatic from it. Was on metformin at 1 point  Consultants:  None Procedures:  None Antibiotics:  None HPI/Subjective:  Pleasant, CBG is improved overnight, advancing diet Objective: Filed Vitals:   12/01/11 0614 12/01/11 1434 12/01/11 2059 12/02/11 0540  BP: 132/93 132/74 151/95 163/96  Pulse: 82 66 69 73  Temp: 98.9 F (37.2 C) 97.7 F (36.5 C) 98.5 F (36.9 C) 98.7 F (37.1 C)  TempSrc: Oral Oral Oral   Resp: 20 18 18 19   Height:      Weight:      SpO2: 94% 100% 100% 99%   No intake or output data in the 24 hours ending 12/02/11 0959  Exam:  General: Pt awake/alert/oriented x4 in no acute distress  Eyes:  PERRL, normal EOM. Sclera clear. No icterus  Neuro: CN II-XII intact w/o focal sensory/motor deficits.  Lymph: No head/neck/groin lymphadenopathy  Psych: No delerium/psychosis/paranoia  HENT: Normocephalic, Mucus membranes moist. No thrush  Neck: Supple, No tracheal deviation  Chest: No chest wall pain w good excursion  CV: Pulses intact. Regular rhythm  Abdomen: Soft. Nondistended. Mildly tender at suprapubic region only. No peritonitis. No incarcerated hernias.  Ext: SCDs BLE. No mjr edema. No cyanosis  Skin: No petechiae / purpurae        Data Reviewed: Basic Metabolic Panel:  Lab 12/01/11 8119 11/28/11 0555 11/28/11 0032  NA 136 133* 132*  K 3.5 3.5 3.7  CL 102 96 96  CO2 24 26 23   GLUCOSE 188* 249* 277*  BUN 7 8 8   CREATININE 0.74 0.79 0.79  CALCIUM 8.9 8.9 9.2  MG -- -- --  PHOS -- -- --    Liver Function Tests:  Lab 11/28/11 0032  AST 18  ALT 26  ALKPHOS 49  BILITOT 0.9  PROT 8.0  ALBUMIN 3.5   No results found for this basename: LIPASE:5,AMYLASE:5 in the last 168 hours No results found for this basename: AMMONIA:5 in the last 168 hours  CBC:  Lab 12/01/11 0710 11/29/11 0500 11/28/11 0555 11/28/11 0032 11/27/11 2031  WBC 8.1 12.0* 12.6* 12.4* 13.1*  NEUTROABS -- -- -- 8.4* --  HGB 13.6 13.7 14.4 15.3 15.8  HCT 39.5 39.9 42.1 43.6 51.0  MCV 84.2 85.3 85.1 83.5 90.4  PLT 222 186 194 197 --    Cardiac Enzymes: No results found  for this basename: CKTOTAL:5,CKMB:5,CKMBINDEX:5,TROPONINI:5 in the last 168 hours BNP (last 3 results) No results found for this basename: PROBNP:3 in the last 8760 hours   CBG:  Lab 12/01/11 2052 12/01/11 1612 12/01/11 1158 12/01/11 0819 12/01/11 0358  GLUCAP 182* 197* 184* 191* 179*    No results found for this or any previous visit (from the past 240 hour(s)).   Studies: Ct Abdomen Pelvis W Contrast  11/27/2011  *RADIOLOGY REPORT*  Clinical Data: Leukocytosis.  Right lower quadrant pain.  CT ABDOMEN AND PELVIS  WITH CONTRAST  Technique:  Multidetector CT imaging of the abdomen and pelvis was performed following the standard protocol during bolus administration of intravenous contrast.  Contrast: OMNIPAQUE IOHEXOL 300 MG/ML  SOLN  Comparison: None.  Findings: The lung bases are clear without focal nodule, mass, or airspace disease.  The heart size is normal.  No significant pleural or pericardial effusion is present.  There is diffuse fatty infiltration of the liver.  No focal hepatic lesions are present.  The spleen is within normal limits.  The stomach, duodenum, pancreas are unremarkable.  The common bile duct and gallbladder are normal.  The adrenal glands are normal bilaterally.  The kidneys and ureters are unremarkable.  Diffuse inflammatory changes are present to about the sigmoid colon at a point where it sweeps to the right, just below the appendix. A small amount of free air is present, compatible with a focal perforation.  This may be contained.  There is no discrete abscess. The more proximal colon is remarkable for mild secondary inflammation of the cecum adjacent to the inflamed area. The remainder of the colon is unremarkable.  There is mild focal inflammation of a loop of small bowel extending past the inflamed sigmoid colon.  The appendix is visualized and within normal limits.  The small bowel is within normal limits.  No significant adenopathy is present.  The urinary bladder is within normal limits.  The bone windows are unremarkable.  IMPRESSION:  1.  Sigmoid diverticulitis. 2.  A small amount of free air is compatible with a focal perforation, likely contained without an abscess. 3.  Secondary inflammation of at least one loop of small bowel adjacent to the diverticulitis. 4.  Hepatic steatosis.   Original Report Authenticated By: [name]. MATTERN, M.D.     Scheduled Meds:   . acetaminophen  1,000 mg Oral TID  . ciprofloxacin  400 mg Intravenous Q12H  . enoxaparin  0.5 mg/kg  Subcutaneous Q24H  . glipiZIDE  2.5 mg Oral BID AC  . insulin aspart  0-15 Units Subcutaneous TID AC & HS  . insulin glargine  25 Units Subcutaneous QHS  . lip balm  1 application Topical BID  . metoprolol tartrate  12.5 mg Oral BID  . metronidazole  500 mg Intravenous Q6H  . pantoprazole (PROTONIX) IV  40 mg Intravenous QHS  . sodium chloride  3 mL Intravenous Q12H  . [DISCONTINUED] enoxaparin  40 mg Subcutaneous Q24H  . [DISCONTINUED] insulin aspart  0-15 Units Subcutaneous Q4H  . [DISCONTINUED] insulin aspart  0-15 Units Subcutaneous TID AC & HS  . [DISCONTINUED] insulin glargine  20 Units Subcutaneous QHS   Continuous Infusions:   . 0.9 % NaCl with KCl 20 mEq / L 50 mL/hr at 12/01/11 8295    Principal Problem:  *Diverticulitis of colon with perforation Active Problems:  DM (diabetes mellitus)  HTN (hypertension)  Obesity, Class III, BMI 40-49.9 (morbid obesity)    Time spent: 40 minutes  [facility]  [facility] Pager [phone]. If 8PM-8AM, please contact night-coverage at [email or web address], password [facility] 12/02/2011, 9:59 AM  LOS: 4 days

## 2011-12-03 LAB — GLUCOSE, CAPILLARY

## 2011-12-03 NOTE — Progress Notes (Signed)
Reviewed and agree.

## 2011-12-04 ENCOUNTER — Observation Stay (HOSPITAL_COMMUNITY): Payer: BC Managed Care – PPO

## 2011-12-04 ENCOUNTER — Ambulatory Visit (INDEPENDENT_AMBULATORY_CARE_PROVIDER_SITE_OTHER): Payer: BC Managed Care – PPO | Admitting: Internal Medicine

## 2011-12-04 ENCOUNTER — Encounter (HOSPITAL_COMMUNITY): Payer: Self-pay

## 2011-12-04 ENCOUNTER — Ambulatory Visit (INDEPENDENT_AMBULATORY_CARE_PROVIDER_SITE_OTHER): Payer: BC Managed Care – PPO | Admitting: General Surgery

## 2011-12-04 ENCOUNTER — Other Ambulatory Visit (INDEPENDENT_AMBULATORY_CARE_PROVIDER_SITE_OTHER): Payer: Self-pay | Admitting: General Surgery

## 2011-12-04 ENCOUNTER — Inpatient Hospital Stay (HOSPITAL_COMMUNITY)
Admission: AD | Admit: 2011-12-04 | Discharge: 2011-12-11 | DRG: 182 | Disposition: A | Discharge status: Home / Self Care | Payer: BC Managed Care – PPO | Source: Ambulatory Visit | Attending: General Surgery | Admitting: General Surgery

## 2011-12-04 ENCOUNTER — Ambulatory Visit: Payer: BC Managed Care – PPO

## 2011-12-04 ENCOUNTER — Encounter (INDEPENDENT_AMBULATORY_CARE_PROVIDER_SITE_OTHER): Payer: Self-pay | Admitting: General Surgery

## 2011-12-04 VITALS — BP 142/88 | HR 82 | Temp 98.0°F | Resp 17 | Ht 73.0 in | Wt 353.0 lb

## 2011-12-04 DIAGNOSIS — K5732 Diverticulitis of large intestine without perforation or abscess without bleeding: Secondary | ICD-10-CM

## 2011-12-04 DIAGNOSIS — E119 Type 2 diabetes mellitus without complications: Secondary | ICD-10-CM

## 2011-12-04 DIAGNOSIS — K5720 Diverticulitis of large intestine with perforation and abscess without bleeding: Secondary | ICD-10-CM

## 2011-12-04 DIAGNOSIS — R109 Unspecified abdominal pain: Secondary | ICD-10-CM

## 2011-12-04 DIAGNOSIS — E66813 Obesity, class 3: Secondary | ICD-10-CM

## 2011-12-04 DIAGNOSIS — I10 Essential (primary) hypertension: Secondary | ICD-10-CM

## 2011-12-04 DIAGNOSIS — K651 Peritoneal abscess: Secondary | ICD-10-CM | POA: Diagnosis present

## 2011-12-04 DIAGNOSIS — Z6841 Body Mass Index (BMI) 40.0 and over, adult: Secondary | ICD-10-CM

## 2011-12-04 DIAGNOSIS — K5792 Diverticulitis of intestine, part unspecified, without perforation or abscess without bleeding: Secondary | ICD-10-CM

## 2011-12-04 DIAGNOSIS — K630 Abscess of intestine: Secondary | ICD-10-CM | POA: Diagnosis present

## 2011-12-04 HISTORY — DX: Diverticulitis of large intestine with perforation and abscess without bleeding: K57.20

## 2011-12-04 HISTORY — DX: Peritoneal abscess: K65.1

## 2011-12-04 LAB — POCT CBC
Lymph, poc: 2.5 (ref 0.6–3.4)
MCHC: 31.1 g/dL — AB (ref 31.8–35.4)
MPV: 8.3 fL (ref 0–99.8)
POC Granulocyte: 10.2 — AB (ref 2–6.9)
POC LYMPH PERCENT: 18.4 %L (ref 10–50)
POC MID %: 6.8 %M (ref 0–12)
RDW, POC: 14.2 %

## 2011-12-04 LAB — COMPREHENSIVE METABOLIC PANEL
ALT: 26 U/L (ref 0–53)
Alkaline Phosphatase: 50 U/L (ref 39–117)
Sodium: 137 mEq/L (ref 135–145)
Total Bilirubin: 0.5 mg/dL (ref 0.3–1.2)
Total Protein: 7.6 g/dL (ref 6.0–8.3)

## 2011-12-04 LAB — GLUCOSE, CAPILLARY: Glucose-Capillary: 182 mg/dL — ABNORMAL HIGH (ref 70–99)

## 2011-12-04 MED ORDER — PANTOPRAZOLE SODIUM 40 MG IV SOLR
40.0000 mg | Freq: Every day | INTRAVENOUS | Status: DC
  Administered 2011-12-04 – 2011-12-09 (×6): 40 mg via INTRAVENOUS
  Filled 2011-12-04 (×7): qty 40

## 2011-12-04 MED ORDER — METOPROLOL TARTRATE 1 MG/ML IV SOLN
5.0000 mg | Freq: Four times a day (QID) | INTRAVENOUS | Status: DC
  Administered 2011-12-04 – 2011-12-07 (×11): 5 mg via INTRAVENOUS
  Filled 2011-12-04 (×15): qty 5

## 2011-12-04 MED ORDER — HYDROMORPHONE HCL PF 1 MG/ML IJ SOLN
1.0000 mg | INTRAMUSCULAR | Status: DC | PRN
  Administered 2011-12-04 – 2011-12-05 (×3): 2 mg via INTRAVENOUS
  Filled 2011-12-04 (×3): qty 2

## 2011-12-04 MED ORDER — HYDROMORPHONE HCL PF 1 MG/ML IJ SOLN
1.0000 mg | INTRAMUSCULAR | Status: DC | PRN
  Administered 2011-12-04 (×2): 1 mg via INTRAVENOUS
  Filled 2011-12-04 (×2): qty 1

## 2011-12-04 MED ORDER — IOHEXOL 300 MG/ML  SOLN
100.0000 mL | Freq: Once | INTRAMUSCULAR | Status: AC | PRN
  Administered 2011-12-04: 100 mL via INTRAVENOUS

## 2011-12-04 MED ORDER — POTASSIUM CHLORIDE IN NACL 20-0.9 MEQ/L-% IV SOLN
INTRAVENOUS | Status: DC
  Administered 2011-12-04 – 2011-12-09 (×5): via INTRAVENOUS
  Administered 2011-12-09: 1000 mL via INTRAVENOUS
  Administered 2011-12-10: 04:00:00 via INTRAVENOUS
  Filled 2011-12-04 (×16): qty 1000

## 2011-12-04 MED ORDER — CIPROFLOXACIN IN D5W 400 MG/200ML IV SOLN
400.0000 mg | Freq: Two times a day (BID) | INTRAVENOUS | Status: DC
  Administered 2011-12-04 – 2011-12-10 (×12): 400 mg via INTRAVENOUS
  Filled 2011-12-04 (×12): qty 200

## 2011-12-04 MED ORDER — ONDANSETRON HCL 4 MG/2ML IJ SOLN
4.0000 mg | Freq: Four times a day (QID) | INTRAMUSCULAR | Status: DC | PRN
  Administered 2011-12-05 – 2011-12-07 (×3): 4 mg via INTRAVENOUS
  Filled 2011-12-04 (×3): qty 2

## 2011-12-04 MED ORDER — INSULIN ASPART 100 UNIT/ML ~~LOC~~ SOLN
0.0000 [IU] | SUBCUTANEOUS | Status: DC
  Administered 2011-12-04 – 2011-12-07 (×15): 3 [IU] via SUBCUTANEOUS
  Administered 2011-12-07: 2 [IU] via SUBCUTANEOUS
  Administered 2011-12-07: 3 [IU] via SUBCUTANEOUS

## 2011-12-04 MED ORDER — DEXTROSE IN LACTATED RINGERS 5 % IV SOLN
INTRAVENOUS | Status: DC
  Administered 2011-12-04: 19:00:00 via INTRAVENOUS
  Filled 2011-12-04: qty 1000

## 2011-12-04 MED ORDER — METRONIDAZOLE IN NACL 5-0.79 MG/ML-% IV SOLN
500.0000 mg | Freq: Three times a day (TID) | INTRAVENOUS | Status: DC
  Administered 2011-12-04 – 2011-12-10 (×17): 500 mg via INTRAVENOUS
  Filled 2011-12-04 (×19): qty 100

## 2011-12-04 NOTE — Progress Notes (Signed)
Patient ID: Lawrence Rowland, male   DOB: 15-Apr-1980, 31 y.o.   MRN: [id number]  Chief Complaint  Patient presents with  . Follow-up    divert w/ worsening symptoms - increased white count/abd pain    HPI Lawrence Rowland is a 31 y.o. male.  Was recently seen at the hospital for a microperforation of diverticulitis. Patient was treated with IV antibiotics n.p.o. And bowel rest, the patient did well and was discharged approximately 2 days ago.  The patient presents with a prior history of increasing abdominal pain as well as diarrhea. Patient underwent laboratory tests today which revealed elevated white blood cell count at 13.6. The patient states no nausea or fevers while at home.  On patient to admission he had no abscess which could be drainable. HPI  Past Medical History  Diagnosis Date  . Diabetes mellitus without complication   . Hypertension     Past Surgical History  Procedure Date  . Fracture surgery     Family History  Problem Relation Age of Onset  . Colon cancer Mother   . Cancer Mother     colon  . Diabetes Father     Social History History  Substance Use Topics  . Smoking status: Never Smoker   . Smokeless tobacco: Not on file  . Alcohol Use: No    No Known Allergies  Current Outpatient Prescriptions  Medication Sig Dispense Refill  . ciprofloxacin (CIPRO) 500 MG tablet Take 1 tablet (500 mg total) by mouth 2 (two) times daily.  20 tablet  2  . glipiZIDE (GLUCOTROL) 2.5 mg TABS Take 0.5 tablets (2.5 mg total) by mouth 2 (two) times daily before a meal.  60 tablet  2  . glucose blood (GLUCOMETER ELITE TEST STRIPS) test strip Use as instructed  100 each  12  . insulin glargine (LANTUS) 100 UNIT/ML injection Inject 25 Units into the skin at bedtime.  30 mL  10  . INSULIN SYRINGE .5CC/29G 29G X 1/2" 0.5 ML MISC Dispense 150 syringes  150 each  10  . metoprolol tartrate (LOPRESSOR) 12.5 mg TABS Take 1 tablet (25 mg total) by mouth daily.  30 tablet  2  .  metroNIDAZOLE (FLAGYL) 500 MG tablet Take 1 tablet (500 mg total) by mouth 3 (three) times daily.  30 tablet  2    Review of Systems Review of Systems  Constitutional: Negative.   HENT: Negative.   Eyes: Negative.   Respiratory: Negative.   Cardiovascular: Negative.   Gastrointestinal: Positive for abdominal pain and diarrhea.  Genitourinary: Negative.   Neurological: Negative.     Blood pressure 142/88, pulse 82, temperature 98 F (36.7 C), temperature source Temporal, resp. rate 17, height 6\' 1"  (1.854 m), weight 353 lb (160.12 kg).  Physical Exam Physical Exam  Constitutional: He is oriented to person, place, and time. He appears well-developed and well-nourished.  HENT:  Head: Normocephalic and atraumatic.  Eyes: Conjunctivae normal and EOM are normal. Pupils are equal, round, and reactive to light.  Neck: Normal range of motion. Neck supple.  Cardiovascular: Normal rate and regular rhythm.   Pulmonary/Chest: Effort normal and breath sounds normal.  Abdominal: Soft. There is tenderness (suprapubic). There is no rebound and no guarding.  Musculoskeletal: Normal range of motion.  Neurological: He is alert and oriented to person, place, and time.    Data Reviewed CBC count 13.6  Assessment    The patient is a 31 year old male with likely diverticulitis and possible abscess. There is also a  new diagnosis of diabetes with an elevated hemoglobin A1c.    Plan    1. We'll plan on proceeding with a CT scan to evaluate his abdomen for possible abscess at this time. Should the case the patient will have to be admitted for IV antibiotics, bowel rest, possible drainage if abscess is found.       Marigene Ehlers., Vilma Will 12/04/2011, 4:21 PM

## 2011-12-04 NOTE — Patient Instructions (Signed)
Diverticulitis °A diverticulum is a small pouch or sac on the colon. Diverticulosis is the presence of these diverticula on the colon. Diverticulitis is the irritation (inflammation) or infection of diverticula. °CAUSES  °The colon and its diverticula contain bacteria. If food particles block the tiny opening to a diverticulum, the bacteria inside can grow and cause an increase in pressure. This leads to infection and inflammation and is called diverticulitis. °SYMPTOMS  °· Abdominal pain and tenderness. Usually, the pain is located on the left side of your abdomen. However, it could be located elsewhere. °· Fever. °· Bloating. °· Feeling sick to your stomach (nausea). °· Throwing up (vomiting). °· Abnormal stools. °DIAGNOSIS  °Your caregiver will take a history and perform a physical exam. Since many things can cause abdominal pain, other tests may be necessary. Tests may include: °· Blood tests. °· Urine tests. °· X-ray of the abdomen. °· CT scan of the abdomen. °Sometimes, surgery is needed to determine if diverticulitis or other conditions are causing your symptoms. °TREATMENT  °Most of the time, you can be treated without surgery. Treatment includes: °· Resting the bowels by only having liquids for a few days. As you improve, you will need to eat a low-fiber diet. °· Intravenous (IV) fluids if you are losing body fluids (dehydrated). °· Antibiotic medicines that treat infections may be given. °· Pain and nausea medicine, if needed. °· Surgery if the inflamed diverticulum has burst. °HOME CARE INSTRUCTIONS  °· Try a clear liquid diet (broth, tea, or water for as long as directed by your caregiver). You may then gradually begin a low-fiber diet as tolerated. A low-fiber diet is a diet with less than 10 grams of fiber. Choose the foods below to reduce fiber in the diet: °· White breads, cereals, rice, and pasta. °· Cooked fruits and vegetables or soft fresh fruits and vegetables without the skin. °· Ground or  well-cooked tender beef, ham, veal, lamb, pork, or poultry. °· Eggs and seafood. °· After your diverticulitis symptoms have improved, your caregiver may put you on a high-fiber diet. A high-fiber diet includes 14 grams of fiber for every 1000 calories consumed. For a standard 2000 calorie diet, you would need 28 grams of fiber. Follow these diet guidelines to help you increase the fiber in your diet. It is important to slowly increase the amount fiber in your diet to avoid gas, constipation, and bloating. °· Choose whole-grain breads, cereals, pasta, and brown rice. °· Choose fresh fruits and vegetables with the skin on. Do not overcook vegetables because the more vegetables are cooked, the more fiber is lost. °· Choose more nuts, seeds, legumes, dried peas, beans, and lentils. °· Look for food products that have greater than 3 grams of fiber per serving on the Nutrition Facts label. °· Take all medicine as directed by your caregiver. °· If your caregiver has given you a follow-up appointment, it is very important that you go. Not going could result in lasting (chronic) or permanent injury, pain, and disability. If there is any problem keeping the appointment, call to reschedule. °SEEK MEDICAL CARE IF:  °· Your pain does not improve. °· You have a hard time advancing your diet beyond clear liquids. °· Your bowel movements do not return to normal. °SEEK IMMEDIATE MEDICAL CARE IF:  °· Your pain becomes worse. °· You have an oral temperature above 102° F (38.9° C), not controlled by medicine. °· You have repeated vomiting. °· You have bloody or black, tarry stools. °· Symptoms   that brought you to your caregiver become worse or are not getting better. °MAKE SURE YOU:  °· Understand these instructions. °· Will watch your condition. °· Will get help right away if you are not doing well or get worse. °Document Released: 10/25/2004 Document Revised: 04/09/2011 Document Reviewed: 02/20/2010 °[employer]® Patient Information  ©2013 [employer]. ° °

## 2011-12-04 NOTE — Progress Notes (Signed)
  Subjective:    Patient ID: Lawrence Rowland, male    DOB: [birth year], 31 y.o.   MRN: [id number]  HPI See comprehensive w/up by Dr. Dareen Piano, and surgeon Dr. Derrell Lolling in hospital. Has diverticulitis with perforation that is healing medically. He feels a little worse today but not near as bad as when in hospital. Some nausea and sense of bloating, actual pain is less. Also no fever, but some diarrhea. IDDM is near control. All labs , scans reviewed  Review of Systems DM, htn, obesity    Objective:   Physical Exam  Constitutional: He is oriented to person, place, and time. He appears well-developed and well-nourished. No distress.  HENT:  Nose: Nose normal.  Eyes: EOM are normal. No scleral icterus.  Neck: Neck supple.  Cardiovascular: Normal rate, regular rhythm and normal heart sounds.   Pulmonary/Chest: Effort normal and breath sounds normal.  Abdominal: Soft. He exhibits no distension. There is tenderness. There is no rebound.  Neurological: He is alert and oriented to person, place, and time.  Skin: Skin is warm and dry.  Psychiatric: He has a normal mood and affect.   Jump test does not cause abdomen pain [facility] reading (PRIMARY) by  Dr.Guest has healing perforation, today is feeling distended and nausea , no perf seen today, has obstruction or ileus. Results for orders placed in visit on 12/04/11  GLUCOSE, POCT (MANUAL RESULT ENTRY)      Component Value Range   POC Glucose 159 (*) 70 - 99 mg/dl  POCT GLYCOSYLATED HEMOGLOBIN (HGB A1C)      Component Value Range   Hemoglobin A1C 11.2    POCT CBC      Component Value Range   WBC 13.6 (*) 4.6 - 10.2 K/uL   Lymph, poc 2.5  0.6 - 3.4   POC LYMPH PERCENT 18.4  10 - 50 %L   MID (cbc) 0.9  0 - 0.9   POC MID % 6.8  0 - 12 %M   POC Granulocyte 10.2 (*) 2 - 6.9   Granulocyte percent 74.8  37 - 80 %G   RBC 5.46  4.69 - 6.13 M/uL   Hemoglobin 15.2  14.1 - 18.1 g/dL   HCT, POC 44.0  10.2 - 53.7 %   MCV 89.5  80 - 97 fL   MCH,  POC 27.8  27 - 31.2 pg   MCHC 31.1 (*) 31.8 - 35.4 g/dL   RDW, POC 72.5     Platelet Count, POC 376  142 - 424 K/uL   MPV 8.3  0 - 99.8 fL   New elevation in wbc Stat radiology read       Assessment & Plan:  Diverticulitis with perforation Nausea

## 2011-12-05 ENCOUNTER — Encounter (HOSPITAL_COMMUNITY): Payer: Self-pay | Admitting: General Surgery

## 2011-12-05 ENCOUNTER — Observation Stay (HOSPITAL_COMMUNITY): Payer: BC Managed Care – PPO

## 2011-12-05 DIAGNOSIS — K651 Peritoneal abscess: Secondary | ICD-10-CM

## 2011-12-05 DIAGNOSIS — E119 Type 2 diabetes mellitus without complications: Secondary | ICD-10-CM

## 2011-12-05 DIAGNOSIS — I10 Essential (primary) hypertension: Secondary | ICD-10-CM

## 2011-12-05 HISTORY — DX: Peritoneal abscess: K65.1

## 2011-12-05 LAB — GLUCOSE, CAPILLARY
Glucose-Capillary: 153 mg/dL — ABNORMAL HIGH (ref 70–99)
Glucose-Capillary: 156 mg/dL — ABNORMAL HIGH (ref 70–99)
Glucose-Capillary: 168 mg/dL — ABNORMAL HIGH (ref 70–99)
Glucose-Capillary: 174 mg/dL — ABNORMAL HIGH (ref 70–99)
Glucose-Capillary: 176 mg/dL — ABNORMAL HIGH (ref 70–99)

## 2011-12-05 LAB — APTT: aPTT: 42 seconds — ABNORMAL HIGH (ref 24–37)

## 2011-12-05 LAB — CBC
HCT: 40 % (ref 39.0–52.0)
MCV: 83.9 fL (ref 78.0–100.0)
Platelets: 308 10*3/uL (ref 150–400)
RBC: 4.77 MIL/uL (ref 4.22–5.81)
RDW: 13 % (ref 11.5–15.5)
WBC: 12.6 10*3/uL — ABNORMAL HIGH (ref 4.0–10.5)

## 2011-12-05 MED ORDER — HYDROMORPHONE HCL PF 1 MG/ML IJ SOLN
1.0000 mg | INTRAMUSCULAR | Status: DC | PRN
  Administered 2011-12-05 – 2011-12-06 (×8): 2 mg via INTRAVENOUS
  Filled 2011-12-05 (×6): qty 2
  Filled 2011-12-05: qty 1
  Filled 2011-12-05 (×2): qty 2

## 2011-12-05 MED ORDER — MIDAZOLAM HCL 2 MG/2ML IJ SOLN
INTRAMUSCULAR | Status: AC
  Filled 2011-12-05: qty 4

## 2011-12-05 MED ORDER — FENTANYL CITRATE 0.05 MG/ML IJ SOLN
INTRAMUSCULAR | Status: AC
  Filled 2011-12-05: qty 4

## 2011-12-05 MED ORDER — HYDRALAZINE HCL 20 MG/ML IJ SOLN: 10.0000 mg | INTRAMUSCULAR | Status: DC | PRN

## 2011-12-05 NOTE — Progress Notes (Signed)
General Surgery [facility]  Patient seen and examined.  Discussed scan results and difficulty with drainage procedure.  Discussed possible need for laparotomy, resection, and colostomy.  He understands.  Will review with radiology and re-scan in hopes of being able to percutaneously drain abscess.  Velora Heckler, MD, [facility] Office: [phone]

## 2011-12-05 NOTE — Progress Notes (Signed)
Subjective: Still hurts says its like a fist balled up in his midstomach.  Before it was mostly in RLQ. Better than origianl pain on admit, but still persistent. Objective: Vital signs in last 24 hours: Temp:  [97.9 F (36.6 C)-99.1 F (37.3 C)] 98.4 F (36.9 C) (11/06 0500) Pulse Rate:  [70-85] 72  (11/06 0500) Resp:  [17-24] 24  (11/05 2101) BP: (124-142)/(68-88) 126/68 mmHg (11/06 0500) SpO2:  [97 %-100 %] 97 % (11/06 0500) Weight:  [353 lb (160.12 kg)] 353 lb (160.12 kg) (11/05 1717) Last BM Date: 12/04/11  Afebrile, VSS, WBC 12.6, diet: NPO, on cipr and flagyl IV  Intake/Output from previous day:   Intake/Output this shift:    General appearance: alert, cooperative and no distress GI: soft, not really tender to palpation just ongoing discomfort.. No distension.  Lab Results:   Basename 12/05/11 0407 12/04/11 1104  WBC 12.6* 13.6*  HGB 13.5 15.2  HCT 40.0 48.9  PLT 308 --    BMET  Basename 12/04/11 1102  NA 137  K 3.8  CL 100  CO2 28  GLUCOSE 152*  BUN 8  CREATININE 0.94  CALCIUM 9.2   PT/INR No results found for this basename: LABPROT:2,INR:2 in the last 72 hours   Lab 12/04/11 1102  AST 25  ALT 26  ALKPHOS 50  BILITOT 0.5  PROT 7.6  ALBUMIN 3.8     Lipase  No results found for this basename: lipase     Studies/Results: Ct Abdomen Pelvis W Contrast  12/04/2011  *RADIOLOGY REPORT*  Clinical Data: Follow-up diverticulitis  CT ABDOMEN AND PELVIS WITH CONTRAST  Technique:  Multidetector CT imaging of the abdomen and pelvis was performed following the standard protocol during bolus administration of intravenous contrast.  Contrast: OMNIPAQUE IOHEXOL 300 MG/ML  SOLN  Comparison: 11/27/2011  Findings: Minimal dependent atelectasis lung bases.  Moderate hepatic steatosis.  Spleen, pancreas, and adrenal glands are within normal limits.  Suspected layering gallbladder sludge (series 2/image 41), without associated inflammatory changes.  No  intrahepatic or extrahepatic ductal dilatation.  Kidneys within normal limits.  No hydronephrosis.  No evidence of bowel obstruction.  Normal appendix.  Sigmoid diverticulitis, with associated 4.0 x 5.4 x 5.6 cm abscess in the right lower abdomen (series 2/image 69).  The abscess is surrounded by loops of small bowel/ileum.  No evidence of abdominal aortic aneurysm.  Small retroperitoneal/right lower quadrant mesenteric lymph nodes measuring up to 12 mm short axis (series 2/53), likely reactive.  No abdominopelvic ascites.  Prostate is unremarkable.  Bladder is within normal limits.  Visualized osseous structures are within normal limits.  IMPRESSION: Sigmoid diverticulitis.  Interval development of an associated 5.6 cm abscess in the right lower abdomen.  These results will be called to the ordering clinician or representative by the Radiologist Assistant, and communication documented in the PACS Dashboard.   Original Report Authenticated By: Charline Bills, M.D.    Dg Abd Acute W/chest  12/04/2011  *RADIOLOGY REPORT*  Clinical Data: And nausea.  ACUTE ABDOMEN SERIES (ABDOMEN 2 VIEW & CHEST 1 VIEW)  Comparison: CT of the abdomen pelvis 11/27/2011.  Findings: The heart size is normal.  The lungs are clear.  The visualized soft tissues and bony thorax are unremarkable.  Supine and upright views the abdomen demonstrate slight prominence of small bowel loops in the left abdomen.  There is no evidence for obstruction or free air.  The axial skeleton is unremarkable.  IMPRESSION:  1.  Vessel loops of small bowel  in the left abdomen may represent a focal ileus associated with the patients known diverticulitis. 2.  No significant free air or obstruction. 3.  No acute cardiopulmonary disease.   Original Report Authenticated By: Marin Roberts, M.D.     Medications:    . ciprofloxacin  400 mg Intravenous Q12H  . insulin aspart  0-15 Units Subcutaneous Q4H  . metoprolol  5 mg Intravenous Q6H  .  metronidazole  500 mg Intravenous Q8H  . pantoprazole (PROTONIX) IV  40 mg Intravenous QHS    Assessment/Plan Sigmoid Diverticulitis with perforation; med rx 10/30-11/03 now back with more pain and 5.6 Cm abscess RLQ. AODM ID HbA1c 13.5 (11/29/11) Hypertension  Body mass index is 46.57   Plan:  I have ask IR to see and drain abscess., continue antibiotics, he's on sliding scale insulin  Diabetes and hypertension diagnosed last admit.  Will get medicine to follow, I have suggested he call Insurance help line to begin looking for OP follow up and Rx.  LOS: 1 day    Lawrence Rowland 12/05/2011

## 2011-12-05 NOTE — Progress Notes (Signed)
Agree with PA note.  The abscess cavity appears almost completely encircled by loops of adherent small bowel.  We will bring down to CT and try positioning him to facilitate the small bowel moving away from the abscess.  If there is a safe window, we'll proceed with drain placement.  Signed,  Sterling Big, MD Vascular & Interventional Radiologist [facility]iology

## 2011-12-05 NOTE — Progress Notes (Signed)
Patient ID: Lawrence Rowland, male   DOB: January 25, 1981, 31 y.o.   MRN: [id number] Request received for CT guided drainage of RLQ abscess ( ? Diverticular) today.Imaging studies reviewed. PMH as below.  Exam: pt awake/alert; chest- CTA bilat; heart- RRR; abd- soft, obese,+BS,tender RLQ Filed Vitals:   12/05/11 0139 12/05/11 0500 12/05/11 1030 12/05/11 1042  BP: 127/83 126/68 143/100 140/76  Pulse: 70 72 67   Temp:  98.4 F (36.9 C) 97.8 F (36.6 C)   TempSrc:  Oral Oral   Resp:   18   Height:      Weight:      SpO2:  97% 98%    Past Medical History  Diagnosis Date  . Diabetes mellitus without complication   . Hypertension   . Intra-abdominal abscess 12/05/2011   Past Surgical History  Procedure Date  . Fracture surgery summer, 1996   Ct Abdomen Pelvis W Contrast  12/04/2011  *RADIOLOGY REPORT*  Clinical Data: Follow-up diverticulitis  CT ABDOMEN AND PELVIS WITH CONTRAST  Technique:  Multidetector CT imaging of the abdomen and pelvis was performed following the standard protocol during bolus administration of intravenous contrast.  Contrast: OMNIPAQUE IOHEXOL 300 MG/ML  SOLN  Comparison: 11/27/2011  Findings: Minimal dependent atelectasis lung bases.  Moderate hepatic steatosis.  Spleen, pancreas, and adrenal glands are within normal limits.  Suspected layering gallbladder sludge (series 2/image 41), without associated inflammatory changes.  No intrahepatic or extrahepatic ductal dilatation.  Kidneys within normal limits.  No hydronephrosis.  No evidence of bowel obstruction.  Normal appendix.  Sigmoid diverticulitis, with associated 4.0 x 5.4 x 5.6 cm abscess in the right lower abdomen (series 2/image 69).  The abscess is surrounded by loops of small bowel/ileum.  No evidence of abdominal aortic aneurysm.  Small retroperitoneal/right lower quadrant mesenteric lymph nodes measuring up to 12 mm short axis (series 2/53), likely reactive.  No abdominopelvic ascites.  Prostate is  unremarkable.  Bladder is within normal limits.  Visualized osseous structures are within normal limits.  IMPRESSION: Sigmoid diverticulitis.  Interval development of an associated 5.6 cm abscess in the right lower abdomen.  These results will be called to the ordering clinician or representative by the Radiologist Assistant, and communication documented in the PACS Dashboard.   Original Report Authenticated By: Charline Bills, M.D.    Ct Abdomen Pelvis W Contrast  11/27/2011  *RADIOLOGY REPORT*  Clinical Data: Leukocytosis.  Right lower quadrant pain.  CT ABDOMEN AND PELVIS WITH CONTRAST  Technique:  Multidetector CT imaging of the abdomen and pelvis was performed following the standard protocol during bolus administration of intravenous contrast.  Contrast: OMNIPAQUE IOHEXOL 300 MG/ML  SOLN  Comparison: None.  Findings: The lung bases are clear without focal nodule, mass, or airspace disease.  The heart size is normal.  No significant pleural or pericardial effusion is present.  There is diffuse fatty infiltration of the liver.  No focal hepatic lesions are present.  The spleen is within normal limits.  The stomach, duodenum, pancreas are unremarkable.  The common bile duct and gallbladder are normal.  The adrenal glands are normal bilaterally.  The kidneys and ureters are unremarkable.  Diffuse inflammatory changes are present to about the sigmoid colon at a point where it sweeps to the right, just below the appendix. A small amount of free air is present, compatible with a focal perforation.  This may be contained.  There is no discrete abscess. The more proximal colon is remarkable for mild secondary inflammation of  the cecum adjacent to the inflamed area. The remainder of the colon is unremarkable.  There is mild focal inflammation of a loop of small bowel extending past the inflamed sigmoid colon.  The appendix is visualized and within normal limits.  The small bowel is within normal limits.  No  significant adenopathy is present.  The urinary bladder is within normal limits.  The bone windows are unremarkable.  IMPRESSION:  1.  Sigmoid diverticulitis. 2.  A small amount of free air is compatible with a focal perforation, likely contained without an abscess. 3.  Secondary inflammation of at least one loop of small bowel adjacent to the diverticulitis. 4.  Hepatic steatosis.   Original Report Authenticated By: [name]. MATTERN, M.D.    Dg Abd Acute W/chest  12/04/2011  *RADIOLOGY REPORT*  Clinical Data: And nausea.  ACUTE ABDOMEN SERIES (ABDOMEN 2 VIEW & CHEST 1 VIEW)  Comparison: CT of the abdomen pelvis 11/27/2011.  Findings: The heart size is normal.  The lungs are clear.  The visualized soft tissues and bony thorax are unremarkable.  Supine and upright views the abdomen demonstrate slight prominence of small bowel loops in the left abdomen.  There is no evidence for obstruction or free air.  The axial skeleton is unremarkable.  IMPRESSION:  1.  Vessel loops of small bowel in the left abdomen may represent a focal ileus associated with the patients known diverticulitis. 2.  No significant free air or obstruction. 3.  No acute cardiopulmonary disease.   Original Report Authenticated By: Marin Roberts, M.D.   Results for orders placed during the hospital encounter of 12/04/11  CBC      Component Value Range   WBC 12.6 (*) 4.0 - 10.5 K/uL   RBC 4.77  4.22 - 5.81 MIL/uL   Hemoglobin 13.5  13.0 - 17.0 g/dL   HCT 16.1  09.6 - 04.5 %   MCV 83.9  78.0 - 100.0 fL   MCH 28.3  26.0 - 34.0 pg   MCHC 33.8  30.0 - 36.0 g/dL   RDW 40.9  81.1 - 91.4 %   Platelets 308  150 - 400 K/uL  GLUCOSE, CAPILLARY      Component Value Range   Glucose-Capillary 161 (*) 70 - 99 mg/dL   Comment 1 Documented in Chart     Comment 2 Notify RN    GLUCOSE, CAPILLARY      Component Value Range   Glucose-Capillary 182 (*) 70 - 99 mg/dL   Comment 1 Notify RN    GLUCOSE, CAPILLARY      Component Value Range     Glucose-Capillary 174 (*) 70 - 99 mg/dL   Comment 1 Notify RN    GLUCOSE, CAPILLARY      Component Value Range   Glucose-Capillary 183 (*) 70 - 99 mg/dL   Comment 1 Notify RN    GLUCOSE, CAPILLARY      Component Value Range   Glucose-Capillary 168 (*) 70 - 99 mg/dL   Comment 1 Notify RN    12/04/2011  Creat 0.94  K 3.8  PT/PTT pending A/P: Pt with hx of pelvic pain and RLQ/diverticular abscess. Plan is for CT guided drainage of abscess today if there is accessible window . Details/risks of procedure d/w pt with his understanding and consent.

## 2011-12-05 NOTE — Progress Notes (Signed)
[facility] Medical Consultation  Lawrence Rowland ZOX:[id number] DOB: December 11, 1980 DOA: 12/04/2011 PCP: Carmelina Dane, MD   Requesting physician: General Surgery; Will  PA  Date of consultation: 12/05/2011 Reason for consultation: Diabetes and HTN management Impression/Recommendations 1. Hypertension-will hold on by mouth medication while he's n.p.o. We'll treat him with hydralazine when necessary for systolic greater than 150 or diastolic greater than 90. Patient will be transitioned to oral medication at the time of discharge. 2. Diabetes-continue sliding scale insulin. At the time of discharge will transitioned to oral medication 3.Intra-abdominal abscess-for surgery 4. Morbid obesity-encourage diet and exercise after he is discharged  I will followup again tomorrow. Please contact me if I can be of assistance in the meanwhile. Thank you for this consultation.   Chief Complaint: Abdominal pain  HPI:  Patient is a 31 year old African American male without any significant past medical history prior to his admission last week for Sigmoid Diverticulitis with perforation; med rx 10/30-11/03 came back with increasing pain and a 5.6 Cm abscess in RLQ. Patient was evaluated by interventional radiology and the recommendation is antibiotic for 48 hours and then reevaluate. Patient stated that prior to being admitted to the hospital he had not seen a primary care doctor in over 5 years. He at one point in time was diagnosed with prediabetes and placed on metformin. At that time he did diet and exercise and he was taken off metformin. Today he complains of abdominal pain otherwise fine no headaches no nausea no vomiting no chest pain.  At the time of his last discharge he was given prescription for metoprolol glipizide and Lantus. He had not yet filled the prescriptions.  Review of Systems:  Negative otherwise stated in the history of present illness.   Past Medical History  Diagnosis  Date  . Diabetes mellitus without complication   . Hypertension   . Intra-abdominal abscess 12/05/2011   Past Surgical History  Procedure Date  . Fracture surgery summer, 1996   Social History: Reports that he has never smoked. He does not have any smokeless tobacco . He reports that he drinks alcohol socially, no  use illicit drugs. He is married with 3 daughters.  No Known Allergies Family History  Problem Relation Age of Onset  . Colon cancer Mother   . Cancer Mother     colon  . Diabetes Father     Prior to Admission medications   Medication Sig Start Date End Date Taking? Authorizing Provider  ciprofloxacin (CIPRO) 500 MG tablet Take 1 tablet (500 mg total) by mouth 2 (two) times daily. 12/02/11  Yes Ardeth Sportsman, MD  glipiZIDE (GLUCOTROL) 5 MG tablet Take 2.5 mg by mouth 2 (two) times daily before a meal.   Yes Historical Provider, MD  insulin glargine (LANTUS) 100 UNIT/ML injection Inject 25 Units into the skin at bedtime. 12/02/11  Yes Richarda Overlie, MD  metoprolol tartrate (LOPRESSOR) 25 MG tablet Take 12.5 mg by mouth 2 (two) times daily.   Yes Historical Provider, MD  metroNIDAZOLE (FLAGYL) 500 MG tablet Take 1 tablet (500 mg total) by mouth 3 (three) times daily. 12/02/11  Yes Ardeth Sportsman, MD  glucose blood (GLUCOMETER ELITE TEST STRIPS) test strip Use as instructed 12/02/11   Richarda Overlie, MD  INSULIN SYRINGE .5CC/29G 29G X 1/2" 0.5 ML MISC Dispense 150 syringes 12/02/11   Richarda Overlie, MD   Physical Exam: Filed Vitals:   12/05/11 1030 12/05/11 1042 12/05/11 1403 12/05/11 1442  BP: 143/100 140/76 137/84 122/64  Pulse: 67  65 62  Temp: 97.8 F (36.6 C)  98.2 F (36.8 C)   TempSrc: Oral  Oral   Resp: 18  18 23   Height:      Weight:      SpO2: 98%  100% 100%     General: Patient is lying in bed. He does not seem to be in any acute distress.  HEENT: Head normal cephalic atraumatic pupils reactive to light throat without erythema  Cardiovascular: Regular rate  rhythm  Respiratory: The auscultation bilaterally. No wheezes rhonchi or rales   Abdomen: Soft diffusely tender positive bowel sounds no guarding or rebound  Skin: Dry and intact  Musculoskeletal: Strength   Psychiatric: Normal mood  Neurologic: Not tested  Labs on Admission:  Basic Metabolic Panel:  Lab 12/04/11 8119 12/01/11 0710  NA 137 136  K 3.8 3.5  CL 100 102  CO2 28 24  GLUCOSE 152* 188*  BUN 8 7  CREATININE 0.94 0.74  CALCIUM 9.2 8.9  MG -- --  PHOS -- --   Liver Function Tests:  Lab 12/04/11 1102  AST 25  ALT 26  ALKPHOS 50  BILITOT 0.5  PROT 7.6  ALBUMIN 3.8   CBC:  Lab 12/05/11 0407 12/04/11 1104 12/01/11 0710 11/29/11 0500  WBC 12.6* 13.6* 8.1 12.0*  NEUTROABS -- -- -- --  HGB 13.5 15.2 13.6 13.7  HCT 40.0 48.9 39.5 39.9  MCV 83.9 89.5 84.2 85.3  PLT 308 -- 222 186   C Lab 12/05/11 1552 12/05/11 1149 12/05/11 0737 12/05/11 0508 12/05/11 0059  GLUCAP 156* 176* 168* 183* 174*    Radiological Exams on Admission: Ct Abdomen Pelvis W Contrast  12/04/2011  *RADIOLOGY REPORT*  Clinical Data: Follow-up diverticulitis  CT ABDOMEN AND PELVIS WITH CONTRAST  Technique:  Multidetector CT imaging of the abdomen and pelvis was performed following the standard protocol during bolus administration of intravenous contrast.  Contrast: OMNIPAQUE IOHEXOL 300 MG/ML  SOLN  Comparison: 11/27/2011  Findings: Minimal dependent atelectasis lung bases.  Moderate hepatic steatosis.  Spleen, pancreas, and adrenal glands are within normal limits.  Suspected layering gallbladder sludge (series 2/image 41), without associated inflammatory changes.  No intrahepatic or extrahepatic ductal dilatation.  Kidneys within normal limits.  No hydronephrosis.  No evidence of bowel obstruction.  Normal appendix.  Sigmoid diverticulitis, with associated 4.0 x 5.4 x 5.6 cm abscess in the right lower abdomen (series 2/image 69).  The abscess is surrounded by loops of small bowel/ileum.  No  evidence of abdominal aortic aneurysm.  Small retroperitoneal/right lower quadrant mesenteric lymph nodes measuring up to 12 mm short axis (series 2/53), likely reactive.  No abdominopelvic ascites.  Prostate is unremarkable.  Bladder is within normal limits.  Visualized osseous structures are within normal limits.  IMPRESSION: Sigmoid diverticulitis.  Interval development of an associated 5.6 cm abscess in the right lower abdomen.  These results will be called to the ordering clinician or representative by the Radiologist Assistant, and communication documented in the PACS Dashboard.   Original Report Authenticated By: Charline Bills, M.D.    Dg Abd Acute W/chest  12/04/2011  *RADIOLOGY REPORT*  Clinical Data: And nausea.  ACUTE ABDOMEN SERIES (ABDOMEN 2 VIEW & CHEST 1 VIEW)  Comparison: CT of the abdomen pelvis 11/27/2011.  Findings: The heart size is normal.  The lungs are clear.  The visualized soft tissues and bony thorax are unremarkable.  Supine and upright views the abdomen demonstrate slight prominence of small bowel loops in  the left abdomen.  There is no evidence for obstruction or free air.  The axial skeleton is unremarkable.  IMPRESSION:  1.  Vessel loops of small bowel in the left abdomen may represent a focal ileus associated with the patients known diverticulitis. 2.  No significant free air or obstruction. 3.  No acute cardiopulmonary disease.   Original Report Authenticated By: Marin Roberts, M.D.     Time spent: 45 minutes  Molli Posey, MD [facility] Team 5 Pager [phone]  If 7PM-7AM, please contact night-coverage [email or web address] Password [email or web address] 12/05/2011, 4:22 PM

## 2011-12-06 ENCOUNTER — Encounter (HOSPITAL_COMMUNITY): Payer: Self-pay | Admitting: General Surgery

## 2011-12-06 DIAGNOSIS — K5720 Diverticulitis of large intestine with perforation and abscess without bleeding: Secondary | ICD-10-CM

## 2011-12-06 HISTORY — DX: Diverticulitis of large intestine with perforation and abscess without bleeding: K57.20

## 2011-12-06 LAB — BASIC METABOLIC PANEL
GFR calc Af Amer: >90 mL/min (ref >90)
GFR calc non Af Amer: >90 mL/min (ref >90)
Potassium: 4 mEq/L (ref 3.5–5.1)
Sodium: 137 mEq/L (ref 135–145)

## 2011-12-06 LAB — GLUCOSE, CAPILLARY: Glucose-Capillary: 189 mg/dL — ABNORMAL HIGH (ref 70–99)

## 2011-12-06 LAB — CBC
MCHC: 33.8 g/dL (ref 30.0–36.0)
Platelets: 301 10*3/uL (ref 150–400)
RDW: 12.7 % (ref 11.5–15.5)

## 2011-12-06 NOTE — Progress Notes (Addendum)
[facility] Internal Medicine PROGRESS NOTE  Lawrence Rowland ZOX:[id number] DOB: 09-Mar-1980 DOA: 12/04/2011 PCP: Carmelina Dane, MD  Requesting physician: Dr. Gerrit Friends, General Surgery Date of consultation: 12/05/2011  Reason for consultation: Diabetes and HTN management  Brief narrative: Lawrence Rowland is a 31 year old man with sigmoid diverticulitis, admitted 11/28/11 under the care of Dr. Gerrit Friends of surgery.  IM was consulted on 12/05/11 for help with management of DM and HTN.  Assessment/Plan:  Principal Problem:  *Intra-abdominal abscess  Management per general surgery.  Continue antibiotics.  For attempts at percutaneous drainage. Active Problems:  DM (diabetes mellitus)  Poor outpatient control given marked elevation of hemoglobin A1c of 11.2%.  CBGs 153-171 over past 24 hours.  Continue moderate scale SSI Q 4 hours.  HTN (hypertension)  Reasonable control on IV metoprolol.    Obesity, Class III, BMI 40-49.9 (morbid obesity)  Counseled on weight loss with diet/exercise.   Anti-infectives:  Cipro 12/04/11--->  Flagyl 12/04/11--->  HPI/Subjective: No specific complaints this morning.  Says he has not had a problem with BP issues, and that he was a bit anxious yesterday.  Does not think his parents have any BP issues.    Objective: Filed Vitals:   12/05/11 1812 12/05/11 2204 12/06/11 0152 12/06/11 0545  BP: 127/72 144/83 118/66 150/94  Pulse: 70 74 69 75  Temp: 98.3 F (36.8 C) 98.9 F (37.2 C) 98.8 F (37.1 C) 98.6 F (37 C)  TempSrc: Oral Oral Oral Oral  Resp: 18 24 20 20   Height:      Weight:      SpO2: 98%  96% 98%    Intake/Output Summary (Last 24 hours) at 12/06/11 0959 Last data filed at 12/06/11 0537  Gross per 24 hour  Intake   3694 ml  Output      0 ml  Net   3694 ml    Exam: Gen:  NAD Cardiovascular:  RRR, No M/R/G Respiratory:  Lungs CTAB Gastrointestinal:  Abdomen soft, NT/ND, + BS Extremities:  No C/E/C  Data  Reviewed: Basic Metabolic Panel:  Lab 12/06/11 8119 12/04/11 1102 12/01/11 0710  NA 137 137 136  K 4.0 3.8 --  CL 100 100 102  CO2 24 28 24   GLUCOSE 142* 152* 188*  BUN 7 8 7   CREATININE 0.76 0.94 0.74  CALCIUM 9.0 9.2 8.9  MG -- -- --  PHOS -- -- --   GFR Estimated Creatinine Clearance: 211.9 ml/min (by C-G formula based on Cr of 0.76). Liver Function Tests:  Lab 12/04/11 1102  AST 25  ALT 26  ALKPHOS 50  BILITOT 0.5  PROT 7.6  ALBUMIN 3.8   Coagulation profile  Lab 12/05/11 1145  INR 1.19  PROTIME --   CBC:  Lab 12/06/11 0437 12/05/11 0407 12/04/11 1104 12/01/11 0710  WBC 11.7* 12.6* 13.6* 8.1  NEUTROABS -- -- -- --  HGB 14.5 13.5 15.2 13.6  HCT 42.9 40.0 48.9 39.5  MCV 84.1 83.9 89.5 84.2  PLT 301 308 -- 222   CBG:  Lab 12/06/11 0734 12/06/11 0429 12/05/11 2346 12/05/11 2014 12/05/11 1552  GLUCAP 163* 166* 153* 171* 156*   Hgb A1c  Basename 12/04/11 1107  HGBA1C 11.2    Procedures and Diagnostic Studies:  Ct Abdomen Pelvis Wo Contrast 12/05/2011  IMPRESSION:  1.  The right lower quadrant diverticular abscess is currently not amenable to CT guided perforation despite optimal patient positioning. The mesenteric abscess cavity is completely encircled by multiple loops of adherent small  bowel.  2.  Recommend intravenous antibiotics and repeat CT abdomen/pelvis with intravenous contrast in 48 - 96 hours.  If the abscess cavity is resolving, percutaneous drainage may not be required.  If the cavity persists, or enlarges there may be an accessible window at that time.  These results were called by telephone on 12/05/11 at 03:00 p.m. to [name], surgical PA, who verbally acknowledged these results.   Original Report Authenticated By: Malachy Moan, M.D.     Ct Abdomen Pelvis W Contrast 12/04/2011 IMPRESSION: Sigmoid diverticulitis.  Interval development of an associated 5.6 cm abscess in the right lower abdomen.  These results will be called to the ordering  clinician or representative by the Radiologist Assistant, and communication documented in the PACS Dashboard.   Original Report Authenticated By: Charline Bills, M.D.     Ct Abdomen Pelvis W Contrast 11/27/2011  IMPRESSION:  1.  Sigmoid diverticulitis. 2.  A small amount of free air is compatible with a focal perforation, likely contained without an abscess. 3.  Secondary inflammation of at least one loop of small bowel adjacent to the diverticulitis. 4.  Hepatic steatosis.   Original Report Authenticated By: [name]. MATTERN, M.D.     Dg Abd Acute W/chest 12/04/2011 IMPRESSION:  1.  Vessel loops of small bowel in the left abdomen may represent a focal ileus associated with the patients known diverticulitis. 2.  No significant free air or obstruction. 3.  No acute cardiopulmonary disease.   Original Report Authenticated By: Marin Roberts, M.D.     Scheduled Meds:    . ciprofloxacin  400 mg Intravenous Q12H  . insulin aspart  0-15 Units Subcutaneous Q4H  . metoprolol  5 mg Intravenous Q6H  . metronidazole  500 mg Intravenous Q8H  . pantoprazole (PROTONIX) IV  40 mg Intravenous QHS   Continuous Infusions:    . 0.9 % NaCl with KCl 20 mEq / L 100 mL/hr at 12/05/11 2252    Time spent: 35 minutes.   LOS: 2 days   Lawrence Rowland  [facility] Pager [phone].  If 8PM-8AM, please contact night-coverage at [email or web address], password [email or web address] 12/06/2011, 9:59 AM

## 2011-12-06 NOTE — Progress Notes (Signed)
Subjective: Up in chair, working some.  Pretty bored and a bit depressed by the whole thing.  Pain is better  Objective: Vital signs in last 24 hours: Temp:  [97.8 F (36.6 C)-98.9 F (37.2 C)] 98.6 F (37 C) (11/07 0545) Pulse Rate:  [62-75] 75  (11/07 0545) Resp:  [18-24] 20  (11/07 0545) BP: (118-150)/(64-100) 150/94 mmHg (11/07 0545) SpO2:  [96 %-100 %] 98 % (11/07 0545) Last BM Date: 12/04/11  NPO, BP up some this AM, afebrile, labs OK, WBC stable,  Intake/Output from previous day: 11/06 0701 - 11/07 0700 In: 3694 [I.V.:3694] Out: -  Intake/Output this shift:    General appearance: alert, cooperative and no distress Resp: clear to auscultation bilaterally GI: soft, not really tender on plapation, few bS, no BM for a couple day.  Lab Results:   [name] 12/06/11 0437 12/05/11 0407  WBC 11.7* 12.6*  HGB 14.5 13.5  HCT 42.9 40.0  PLT 301 308    BMET  Basename 12/06/11 0437 12/04/11 1102  NA 137 137  K 4.0 3.8  CL 100 100  CO2 24 28  GLUCOSE 142* 152*  BUN 7 8  CREATININE 0.76 0.94  CALCIUM 9.0 9.2   PT/INR  Basename 12/05/11 1145  LABPROT 14.9  INR 1.19     Lab 12/04/11 1102  AST 25  ALT 26  ALKPHOS 50  BILITOT 0.5  PROT 7.6  ALBUMIN 3.8     Lipase  No results found for this basename: lipase     Studies/Results: Ct Abdomen Pelvis Wo Contrast  12/05/2011  *RADIOLOGY REPORT*  Clinical Data: 31 year old male with perforated divert to colitis and focal diverticular abscess in the right lower quadrant.  He presents for an attempt at CT guided aspiration/drain placement.  CT ABDOMEN AND PELVIS WITHOUT CONTRAST  Technique:  Multidetector CT imaging of the abdomen and pelvis was performed following the standard protocol without intravenous contrast.  Comparison: CT abdomen/pelvis 12/04/2011  Findings: The patient was placed in a partial left lateral decubitus position to facilitate movement of bowel loops away from the diverticular abscess site.   A planning axial CT scan was performed.  Unfortunately, there is no safe window into the abscess cavity.  There are multiple loops of small bowel encircling and adherent to the abscess cavity and surrounding mesenteric phlegmon. The procedure was terminated the patient was returned to his room.  IMPRESSION:  1.  The right lower quadrant diverticular abscess is currently not amenable to CT guided perforation despite optimal patient positioning. The mesenteric abscess cavity is completely encircled by multiple loops of adherent small bowel.  2.  Recommend intravenous antibiotics and repeat CT abdomen/pelvis with intravenous contrast in 48 - 96 hours.  If the abscess cavity is resolving, percutaneous drainage may not be required.  If the cavity persists, or enlarges there may be an accessible window at that time.  These results were called by telephone on 12/05/11 at 03:00 p.m. to [name], surgical PA, who verbally acknowledged these results.   Original Report Authenticated By: Malachy Moan, M.D.    Ct Abdomen Pelvis W Contrast  12/04/2011  *RADIOLOGY REPORT*  Clinical Data: Follow-up diverticulitis  CT ABDOMEN AND PELVIS WITH CONTRAST  Technique:  Multidetector CT imaging of the abdomen and pelvis was performed following the standard protocol during bolus administration of intravenous contrast.  Contrast: OMNIPAQUE IOHEXOL 300 MG/ML  SOLN  Comparison: 11/27/2011  Findings: Minimal dependent atelectasis lung bases.  Moderate hepatic steatosis.  Spleen, pancreas, and  adrenal glands are within normal limits.  Suspected layering gallbladder sludge (series 2/image 41), without associated inflammatory changes.  No intrahepatic or extrahepatic ductal dilatation.  Kidneys within normal limits.  No hydronephrosis.  No evidence of bowel obstruction.  Normal appendix.  Sigmoid diverticulitis, with associated 4.0 x 5.4 x 5.6 cm abscess in the right lower abdomen (series 2/image 69).  The abscess is surrounded by  loops of small bowel/ileum.  No evidence of abdominal aortic aneurysm.  Small retroperitoneal/right lower quadrant mesenteric lymph nodes measuring up to 12 mm short axis (series 2/53), likely reactive.  No abdominopelvic ascites.  Prostate is unremarkable.  Bladder is within normal limits.  Visualized osseous structures are within normal limits.  IMPRESSION: Sigmoid diverticulitis.  Interval development of an associated 5.6 cm abscess in the right lower abdomen.  These results will be called to the ordering clinician or representative by the Radiologist Assistant, and communication documented in the PACS Dashboard.   Original Report Authenticated By: Charline Bills, M.D.    Dg Abd Acute W/chest  12/04/2011  *RADIOLOGY REPORT*  Clinical Data: And nausea.  ACUTE ABDOMEN SERIES (ABDOMEN 2 VIEW & CHEST 1 VIEW)  Comparison: CT of the abdomen pelvis 11/27/2011.  Findings: The heart size is normal.  The lungs are clear.  The visualized soft tissues and bony thorax are unremarkable.  Supine and upright views the abdomen demonstrate slight prominence of small bowel loops in the left abdomen.  There is no evidence for obstruction or free air.  The axial skeleton is unremarkable.  IMPRESSION:  1.  Vessel loops of small bowel in the left abdomen may represent a focal ileus associated with the patients known diverticulitis. 2.  No significant free air or obstruction. 3.  No acute cardiopulmonary disease.   Original Report Authenticated By: Marin Roberts, M.D.     Medications:    . ciprofloxacin  400 mg Intravenous Q12H  . insulin aspart  0-15 Units Subcutaneous Q4H  . metoprolol  5 mg Intravenous Q6H  . metronidazole  500 mg Intravenous Q8H  . pantoprazole (PROTONIX) IV  40 mg Intravenous QHS    Assessment/Plan Sigmoid Diverticulitis with perforation; med rx 10/30-11/03 now back with more pain and 5.6 Cm abscess RLQ.  AODM ID HbA1c 13.5 (11/29/11)  Hypertension  Body mass index is 46.57    Plan:   Bowel rest, IV antibiotics, and re scan in a few days look for a window for a drain. I am going to let him have some clear liquids today.     LOS: 2 days    Lawrence Rowland 12/06/2011

## 2011-12-06 NOTE — Progress Notes (Signed)
General Surgery [facility]  Patient seen & examined.  Mild tenderness, pain.  No fever or chills.  Will review with radiologist - ? Timing of follow up CT scan and possible attempt at drainage.  In the mean time, continue clear liquid diet and IV abx.  Velora Heckler, MD, [facility] Office: [phone]

## 2011-12-07 LAB — GLUCOSE, CAPILLARY: Glucose-Capillary: 145 mg/dL — ABNORMAL HIGH (ref 70–99)

## 2011-12-07 MED ORDER — INSULIN ASPART 100 UNIT/ML ~~LOC~~ SOLN: 0.0000 [IU] | Freq: Every day | SUBCUTANEOUS | Status: DC

## 2011-12-07 MED ORDER — METOPROLOL TARTRATE 12.5 MG HALF TABLET
12.5000 mg | ORAL_TABLET | Freq: Two times a day (BID) | ORAL | Status: DC
  Administered 2011-12-07 – 2011-12-11 (×8): 12.5 mg via ORAL
  Filled 2011-12-07 (×10): qty 1

## 2011-12-07 MED ORDER — INSULIN GLARGINE 100 UNIT/ML ~~LOC~~ SOLN
5.0000 [IU] | Freq: Every day | SUBCUTANEOUS | Status: DC
  Administered 2011-12-07: 5 [IU] via SUBCUTANEOUS

## 2011-12-07 MED ORDER — INSULIN ASPART 100 UNIT/ML ~~LOC~~ SOLN
0.0000 [IU] | Freq: Three times a day (TID) | SUBCUTANEOUS | Status: DC
  Administered 2011-12-07 – 2011-12-08 (×2): 3 [IU] via SUBCUTANEOUS

## 2011-12-07 NOTE — Progress Notes (Signed)
Subjective: Feels better pain in mid abdomen and RLQ is much better.  Having allot of loose stools now.   Objective: Vital signs in last 24 hours: Temp:  [98.3 F (36.8 C)-99.3 F (37.4 C)] 99.3 F (37.4 C) (11/08 0509) Pulse Rate:  [77-79] 77  (11/08 0509) Resp:  [20] 20  (11/08 0509) BP: (130-144)/(68-96) 142/96 mmHg (11/08 0509) SpO2:  [98 %-100 %] 100 % (11/08 0509) Last BM Date: 12/07/11  1560 PO yesterday, Temp 99 range, BP up, no labs, Pt reports multiple loose stools, one reported on I/O.   Intake/Output from previous day: 11/07 0701 - 11/08 0700 In: 4093.3 [P.O.:1560; I.V.:2533.3] Out: -  Intake/Output this shift:    General appearance: alert, cooperative and no distress GI: soft, non-tender; bowel sounds normal; no masses,  no organomegaly and tenderness was in the mid abodmen below umbilicus and to RLQ.  He can still feel something with palpation of RLQ, but barely..  Lab Results:   Basename 12/06/11 0437 12/05/11 0407  WBC 11.7* 12.6*  HGB 14.5 13.5  HCT 42.9 40.0  PLT 301 308    BMET  Basename 12/06/11 0437 12/04/11 1102  NA 137 137  K 4.0 3.8  CL 100 100  CO2 24 28  GLUCOSE 142* 152*  BUN 7 8  CREATININE 0.76 0.94  CALCIUM 9.0 9.2   PT/INR  Basename 12/05/11 1145  LABPROT 14.9  INR 1.19     Lab 12/04/11 1102  AST 25  ALT 26  ALKPHOS 50  BILITOT 0.5  PROT 7.6  ALBUMIN 3.8     Lipase  No results found for this basename: lipase     Studies/Results: Ct Abdomen Pelvis Wo Contrast  12/05/2011  *RADIOLOGY REPORT*  Clinical Data: 31 year old male with perforated divert to colitis and focal diverticular abscess in the right lower quadrant.  He presents for an attempt at CT guided aspiration/drain placement.  CT ABDOMEN AND PELVIS WITHOUT CONTRAST  Technique:  Multidetector CT imaging of the abdomen and pelvis was performed following the standard protocol without intravenous contrast.  Comparison: CT abdomen/pelvis 12/04/2011  Findings:  The patient was placed in a partial left lateral decubitus position to facilitate movement of bowel loops away from the diverticular abscess site.  A planning axial CT scan was performed.  Unfortunately, there is no safe window into the abscess cavity.  There are multiple loops of small bowel encircling and adherent to the abscess cavity and surrounding mesenteric phlegmon. The procedure was terminated the patient was returned to his room.  IMPRESSION:  1.  The right lower quadrant diverticular abscess is currently not amenable to CT guided perforation despite optimal patient positioning. The mesenteric abscess cavity is completely encircled by multiple loops of adherent small bowel.  2.  Recommend intravenous antibiotics and repeat CT abdomen/pelvis with intravenous contrast in 48 - 96 hours.  If the abscess cavity is resolving, percutaneous drainage may not be required.  If the cavity persists, or enlarges there may be an accessible window at that time.  These results were called by telephone on 12/05/11 at 03:00 p.m. to [name], surgical PA, who verbally acknowledged these results.   Original Report Authenticated By: Malachy Moan, M.D.     Medications:    . ciprofloxacin  400 mg Intravenous Q12H  . insulin aspart  0-15 Units Subcutaneous Q4H  . metoprolol  5 mg Intravenous Q6H  . metronidazole  500 mg Intravenous Q8H  . pantoprazole (PROTONIX) IV  40 mg Intravenous QHS  Assessment/Plan Sigmoid Diverticulitis with perforation; med rx 10/30-11/03 now back with more pain and 5.6 Cm abscess RLQ.  AODM ID HbA1c 13.5 (11/29/11)  Hypertension  Body mass index is 46.57    Plan:  I talked with IR and we will repeat scan Sunday Am.  I will recheck his labs tomorrow, go to Full liquid diet today.     LOS: 3 days    Roxane Puerto 12/07/2011

## 2011-12-07 NOTE — Progress Notes (Signed)
General Surgery [facility]  Patient comfortable.  No complaints.  Plan to repeat CT abdomen on Sunday, 11/10.  IR will review for possible drainage procedure.  Velora Heckler, MD, [facility] Office: [phone]

## 2011-12-07 NOTE — Progress Notes (Signed)
   CARE MANAGEMENT NOTE 12/07/2011  Patient:  SNOW-BARNES,Zaden   Account Number:  [id number]  Date Initiated:  12/06/2011  Documentation initiated by:  PEARSON,COOKIE  Subjective/Objective Assessment:   pt admitted with cco abd pain, intra abdominal abscess     Action/Plan:   from home   Anticipated DC Date:  12/08/2011   Anticipated DC Plan:  HOME/SELF CARE      DC Planning Services  CM consult          Status of service:  In process, will continue to follow Medicare Important Message given?  NA - LOS <3 / Initial given by admissions (If response is "NO", the following Medicare IM given date fields will be blank)  Discharge Disposition:  HOME/SELF CARE  Per UR Regulation:  Reviewed for med. necessity/level of care/duration of stay   Comments:  12/07/2011- PATIENT WAS INDEPENDENT PRIOR TO ADMISSION; RECENTLY DISCHARGED FROM THE HOSPITAL 2 DAYS AGO; PATIENT HAS PRIVATE INSURANCE - BLUE CROSS BLUE SHIELD WITH PRESCRIPTION COVERAGE; LIVES AT HOME WITH SPOUSE; PATIENT GOES TO [facility] FOR MEDICAL CARE; B Jaliyah Fotheringham RN

## 2011-12-07 NOTE — Progress Notes (Signed)
[facility] Internal Medicine PROGRESS NOTE  Lawrence Rowland ZOX:[id number] DOB: Jul 31, 1980 DOA: 12/04/2011 PCP: Carmelina Dane, MD  Requesting physician: Dr. Gerrit Friends, General Surgery Date of consultation: 12/05/2011  Reason for consultation: Diabetes and HTN management  Brief narrative: Lawrence Rowland is a 31 year old man with sigmoid diverticulitis, admitted 11/28/11 under the care of Dr. Gerrit Friends of surgery.  IM was consulted on 12/05/11 for help with management of DM and HTN.  Assessment/Plan:  Principal Problem:  *Intra-abdominal abscess  Management per general surgery.  Continue antibiotics.  For attempts at percutaneous drainage.  CT scan ordered for 12/09/11. Active Problems:  DM (diabetes mellitus)  Poor outpatient control given marked elevation of hemoglobin A1c of 11.2%.  CBGs 143-189 over past 24 hours.  Continue moderate scale, but change SSI coverage to Q AC/HS since eating now.  HTN (hypertension)  Reasonable control on IV metoprolol.  Will change to oral route.  Obesity, Class III, BMI 40-49.9 (morbid obesity)  Counseled on weight loss with diet/exercise.   Anti-infectives:  Cipro 12/04/11--->  Flagyl 12/04/11--->  HPI/Subjective: Lawrence Rowland says he still has occasional right lower abdominal pain, but that it is bearable, and he has not needed to take any pain medication for it.  Reports 4 loose stools in past 8 hours.  No N/V.    Objective: Filed Vitals:   12/06/11 0545 12/06/11 1300 12/06/11 2132 12/07/11 0509  BP: 150/94 130/76 144/68 142/96  Pulse: 75 79 77 77  Temp: 98.6 F (37 C) 98.3 F (36.8 C) 99.2 F (37.3 C) 99.3 F (37.4 C)  TempSrc: Oral  Oral Oral  Resp: 20 20 20 20   Height:      Weight:      SpO2: 98% 98% 99% 100%    Intake/Output Summary (Last 24 hours) at 12/07/11 1208 Last data filed at 12/07/11 0900  Gross per 24 hour  Intake 4453.33 ml  Output      0 ml  Net 4453.33 ml    Exam: Gen:  NAD Cardiovascular:   RRR, No M/R/G Respiratory:  Lungs CTAB Gastrointestinal:  Abdomen soft, NT/ND, + BS Extremities:  No C/E/C  Data Reviewed: Basic Metabolic Panel:  Lab 12/06/11 8119 12/04/11 1102 12/01/11 0710  NA 137 137 136  K 4.0 3.8 --  CL 100 100 102  CO2 24 28 24   GLUCOSE 142* 152* 188*  BUN 7 8 7   CREATININE 0.76 0.94 0.74  CALCIUM 9.0 9.2 8.9  MG -- -- --  PHOS -- -- --   GFR Estimated Creatinine Clearance: 211.9 ml/min (by C-G formula based on Cr of 0.76). Liver Function Tests:  Lab 12/04/11 1102  AST 25  ALT 26  ALKPHOS 50  BILITOT 0.5  PROT 7.6  ALBUMIN 3.8   Coagulation profile  Lab 12/05/11 1145  INR 1.19  PROTIME --   CBC:  Lab 12/06/11 0437 12/05/11 0407 12/04/11 1104 12/01/11 0710  WBC 11.7* 12.6* 13.6* 8.1  NEUTROABS -- -- -- --  HGB 14.5 13.5 15.2 13.6  HCT 42.9 40.0 48.9 39.5  MCV 84.1 83.9 89.5 84.2  PLT 301 308 -- 222   CBG:  Lab 12/07/11 1134 12/07/11 0756 12/07/11 0421 12/07/11 0002 12/06/11 1959  GLUCAP 174* 176* 145* 143* 189*   Hgb A1c No results found for this basename: HGBA1C:2 in the last 72 hours  Procedures and Diagnostic Studies:  Ct Abdomen Pelvis Wo Contrast 12/05/2011  IMPRESSION:  1.  The right lower quadrant diverticular abscess is currently not amenable  to CT guided perforation despite optimal patient positioning. The mesenteric abscess cavity is completely encircled by multiple loops of adherent small bowel.  2.  Recommend intravenous antibiotics and repeat CT abdomen/pelvis with intravenous contrast in 48 - 96 hours.  If the abscess cavity is resolving, percutaneous drainage may not be required.  If the cavity persists, or enlarges there may be an accessible window at that time.  These results were called by telephone on 12/05/11 at 03:00 p.m. to [name], surgical PA, who verbally acknowledged these results.   Original Report Authenticated By: Malachy Moan, M.D.     Ct Abdomen Pelvis W Contrast 12/04/2011 IMPRESSION: Sigmoid  diverticulitis.  Interval development of an associated 5.6 cm abscess in the right lower abdomen.  These results will be called to the ordering clinician or representative by the Radiologist Assistant, and communication documented in the PACS Dashboard.   Original Report Authenticated By: Charline Bills, M.D.     Ct Abdomen Pelvis W Contrast 11/27/2011  IMPRESSION:  1.  Sigmoid diverticulitis. 2.  A small amount of free air is compatible with a focal perforation, likely contained without an abscess. 3.  Secondary inflammation of at least one loop of small bowel adjacent to the diverticulitis. 4.  Hepatic steatosis.   Original Report Authenticated By: [name]. MATTERN, M.D.     Dg Abd Acute W/chest 12/04/2011 IMPRESSION:  1.  Vessel loops of small bowel in the left abdomen may represent a focal ileus associated with the patients known diverticulitis. 2.  No significant free air or obstruction. 3.  No acute cardiopulmonary disease.   Original Report Authenticated By: Marin Roberts, M.D.     Scheduled Meds:    . ciprofloxacin  400 mg Intravenous Q12H  . insulin aspart  0-15 Units Subcutaneous Q4H  . metoprolol  5 mg Intravenous Q6H  . metronidazole  500 mg Intravenous Q8H  . pantoprazole (PROTONIX) IV  40 mg Intravenous QHS   Continuous Infusions:    . 0.9 % NaCl with KCl 20 mEq / L 100 mL/hr at 12/06/11 1030    Time spent: 25 minutes.   LOS: 3 days   Lawrence Rowland  [facility] Pager [phone].  If 8PM-8AM, please contact night-coverage at [email or web address], password [facility] 12/07/2011, 12:08 PM

## 2011-12-07 NOTE — Progress Notes (Signed)
Inpatient Diabetes Program Recommendations  AACE/ADA: New Consensus Statement on Inpatient Glycemic Control (2013)  Target Ranges:  Prepandial:   less than 140 mg/dL      Peak postprandial:   less than 180 mg/dL (1-2 hours)      Critically ill patients:  140 - 180 mg/dL   Reason for Visit: [id number] of 11.2% down from 13.5% on 11/29/11  31 y.o. Male admitted with abdominal pain. Was recently seen at the hospital for a microperforation of diverticulitis. Patient was treated with IV antibiotics n.p.o.The patient did well and was discharged approximately 2 days ago. Recently diagnosed with diabetes.  CBGs have been excellent during this admission. Results for Lawrence Rowland, Lawrence Rowland (MRN [id number]) as of 12/07/2011 15:06  Ref. Range 12/06/2011 07:34 12/06/2011 12:00 12/06/2011 17:15 12/06/2011 19:59 12/07/2011 00:02 12/07/2011 04:21 12/07/2011 07:56 12/07/2011 11:34  Glucose-Capillary Latest Range: 70-99 mg/dL 846 (H) 962 (H) 952 (H) 189 (H) 143 (H) 145 (H) 176 (H) 174 (H)  Results for Lawrence Rowland, Lawrence Rowland (MRN [id number]) as of 12/07/2011 15:06  Ref. Range 12/04/2011 11:07  Hemoglobin A1C Latest Range: <5.7 % 11.2  Results for Lawrence Rowland, Lawrence Rowland (MRN [id number]) as of 12/07/2011 15:06  Ref. Range 12/06/2011 04:37  Sodium Latest Range: 135-145 mEq/L 137  Potassium Latest Range: 3.5-5.1 mEq/L 4.0  Chloride Latest Range: 96-112 mEq/L 100  CO2 Latest Range: 19-32 mEq/L 24  BUN Latest Range: 6-23 mg/dL 7  Creatinine Latest Range: 0.50-1.35 mg/dL 4.03  Calcium Latest Range: 8.4-10.5 mg/dL 9.0  GFR calc non Af Amer Latest Range: >90 mL/min >90  GFR calc Af Amer Latest Range: >90 mL/min >90  Glucose Latest Range: 70-99 mg/dL 474 (H)       Inpatient Diabetes Program Recommendations Insulin - Basal: To begin Lantus 5 units QHS Correction (SSI): Increased Novolog to moderate tidwc and hs Diet: When diet advanced, recommend CHO mod med  Note: Recently discharged home on Lantus 25 units QHS and glipizide 2.5 mg  bid.  Pt states his wife has set up appt with a diabetes specialist and he has been checking blood sugars qid.  Keep record of CBGs in cell phone to give to MD.  States he is motivated to control diabetes, no c/o in taking Lantus.  Pt has viewed DM videos and has diabetes book from previous visit.    Will follow.

## 2011-12-08 LAB — BASIC METABOLIC PANEL
BUN: 5 mg/dL — ABNORMAL LOW (ref 6–23)
CO2: 24 mEq/L (ref 19–32)
Chloride: 103 mEq/L (ref 96–112)
Creatinine, Ser: 0.86 mg/dL (ref 0.50–1.35)

## 2011-12-08 LAB — CBC
HCT: 38.4 % — ABNORMAL LOW (ref 39.0–52.0)
MCV: 83.7 fL (ref 78.0–100.0)
RBC: 4.59 MIL/uL (ref 4.22–5.81)
RDW: 12.8 % (ref 11.5–15.5)
WBC: 6.8 10*3/uL (ref 4.0–10.5)

## 2011-12-08 LAB — GLUCOSE, CAPILLARY

## 2011-12-08 MED ORDER — INSULIN ASPART 100 UNIT/ML ~~LOC~~ SOLN
4.0000 [IU] | Freq: Three times a day (TID) | SUBCUTANEOUS | Status: DC
  Administered 2011-12-08 (×2): 4 [IU] via SUBCUTANEOUS

## 2011-12-08 MED ORDER — INSULIN ASPART 100 UNIT/ML ~~LOC~~ SOLN
0.0000 [IU] | Freq: Three times a day (TID) | SUBCUTANEOUS | Status: DC
  Administered 2011-12-08: 2 [IU] via SUBCUTANEOUS
  Administered 2011-12-08: 5 [IU] via SUBCUTANEOUS
  Administered 2011-12-09 – 2011-12-10 (×6): 3 [IU] via SUBCUTANEOUS

## 2011-12-08 MED ORDER — INSULIN ASPART 100 UNIT/ML ~~LOC~~ SOLN: 0.0000 [IU] | Freq: Every day | SUBCUTANEOUS | Status: DC

## 2011-12-08 MED ORDER — INSULIN GLARGINE 100 UNIT/ML ~~LOC~~ SOLN
10.0000 [IU] | Freq: Every day | SUBCUTANEOUS | Status: DC
  Administered 2011-12-08 – 2011-12-10 (×3): 10 [IU] via SUBCUTANEOUS

## 2011-12-08 NOTE — Progress Notes (Signed)
[facility] Internal Medicine PROGRESS NOTE  [name]:[id number] DOB: Jun 28, 1980 DOA: 12/04/2011 PCP: Carmelina Dane, MD  Requesting physician: Dr. Gerrit Friends, General Surgery Date of consultation: 12/05/2011  Reason for consultation: Diabetes and HTN management  Brief narrative: [name] is a 31 year old man with sigmoid diverticulitis, admitted 11/28/11 under the care of Dr. Gerrit Friends of surgery.  IM was consulted on 12/05/11 for help with management of DM and HTN.  Assessment/Plan:  Principal Problem:  *Intra-abdominal abscess  Management per general surgery.  Continue antibiotics.  For attempts at percutaneous drainage.  CT scan ordered for 12/09/11. Active Problems:  DM (diabetes mellitus)  Poor outpatient control given marked elevation of hemoglobin A1c of 11.2%.  CBGs 168-234 over past 24 hours.  Continue moderate scale Q AC/HS, increase Lantus and add meal coverage.Marland Kitchen  HTN (hypertension)  Continue PO metoprolol.   Obesity, Class III, BMI 40-49.9 (morbid obesity)  Counseled on weight loss with diet/exercise.   Anti-infectives:  Cipro 12/04/11--->  Flagyl 12/04/11--->  HPI/Subjective: [name] reports 5 loose stools in past 24 hours.  No N/V.  Pain improved.  Objective: Filed Vitals:   12/07/11 1349 12/07/11 2100 12/08/11 0600 12/08/11 1004  BP: 107/71 143/90 142/84 143/86  Pulse: 85 63 61   Temp: 98.3 F (36.8 C) 98.5 F (36.9 C) 98.5 F (36.9 C)   TempSrc: Oral Oral Oral   Resp: 20 18 18    Height:      Weight:      SpO2: 100% 99% 100%     Intake/Output Summary (Last 24 hours) at 12/08/11 1158 Last data filed at 12/08/11 0852  Gross per 24 hour  Intake 2496.67 ml  Output      1 ml  Net 2495.67 ml    Exam: Gen:  NAD Cardiovascular:  RRR, No M/R/G Respiratory:  Lungs CTAB Gastrointestinal:  Abdomen soft, NT/ND, + BS Extremities:  No C/E/C  Data Reviewed: Basic Metabolic Panel:  Lab 12/08/11 8119 12/06/11 0437  12/04/11 1102  NA 137 137 137  K 3.6 4.0 --  CL 103 100 100  CO2 24 24 28   GLUCOSE 171* 142* 152*  BUN 5* 7 8  CREATININE 0.86 0.76 0.94  CALCIUM 8.6 9.0 9.2  MG -- -- --  PHOS -- -- --   GFR Estimated Creatinine Clearance: 197.2 ml/min (by C-G formula based on Cr of 0.86). Liver Function Tests:  Lab 12/04/11 1102  AST 25  ALT 26  ALKPHOS 50  BILITOT 0.5  PROT 7.6  ALBUMIN 3.8   Coagulation profile  Lab 12/05/11 1145  INR 1.19  PROTIME --   CBC:  Lab 12/08/11 0435 12/06/11 0437 12/05/11 0407 12/04/11 1104  WBC 6.8 11.7* 12.6* 13.6*  NEUTROABS -- -- -- --  HGB 13.1 14.5 13.5 15.2  HCT 38.4* 42.9 40.0 48.9  MCV 83.7 84.1 83.9 89.5  PLT 277 301 308 --   CBG:  Lab 12/08/11 1130 12/08/11 0738 12/07/11 2128 12/07/11 1628 12/07/11 1134  GLUCAP 234* 184* 181* 168* 174*   Hgb A1c No results found for this basename: HGBA1C:2 in the last 72 hours  Procedures and Diagnostic Studies:  Ct Abdomen Pelvis Wo Contrast 12/05/2011  IMPRESSION:  1.  The right lower quadrant diverticular abscess is currently not amenable to CT guided perforation despite optimal patient positioning. The mesenteric abscess cavity is completely encircled by multiple loops of adherent small bowel.  2.  Recommend intravenous antibiotics and repeat CT abdomen/pelvis with intravenous contrast in 48 -  96 hours.  If the abscess cavity is resolving, percutaneous drainage may not be required.  If the cavity persists, or enlarges there may be an accessible window at that time.  These results were called by telephone on 12/05/11 at 03:00 p.m. to [name], surgical PA, who verbally acknowledged these results.   Original Report Authenticated By: Malachy Moan, M.D.     Ct Abdomen Pelvis W Contrast 12/04/2011 IMPRESSION: Sigmoid diverticulitis.  Interval development of an associated 5.6 cm abscess in the right lower abdomen.  These results will be called to the ordering clinician or representative by the  Radiologist Assistant, and communication documented in the PACS Dashboard.   Original Report Authenticated By: Charline Bills, M.D.     Ct Abdomen Pelvis W Contrast 11/27/2011  IMPRESSION:  1.  Sigmoid diverticulitis. 2.  A small amount of free air is compatible with a focal perforation, likely contained without an abscess. 3.  Secondary inflammation of at least one loop of small bowel adjacent to the diverticulitis. 4.  Hepatic steatosis.   Original Report Authenticated By: [name]. MATTERN, M.D.     Dg Abd Acute W/chest 12/04/2011 IMPRESSION:  1.  Vessel loops of small bowel in the left abdomen may represent a focal ileus associated with the patients known diverticulitis. 2.  No significant free air or obstruction. 3.  No acute cardiopulmonary disease.   Original Report Authenticated By: Marin Roberts, M.D.     Scheduled Meds:    . ciprofloxacin  400 mg Intravenous Q12H  . insulin aspart  0-15 Units Subcutaneous TID WC  . insulin aspart  0-5 Units Subcutaneous QHS  . insulin glargine  5 Units Subcutaneous QHS  . metoprolol tartrate  12.5 mg Oral BID  . metronidazole  500 mg Intravenous Q8H  . pantoprazole (PROTONIX) IV  40 mg Intravenous QHS  . [DISCONTINUED] insulin aspart  0-15 Units Subcutaneous Q4H  . [DISCONTINUED] metoprolol  5 mg Intravenous Q6H   Continuous Infusions:    . 0.9 % NaCl with KCl 20 mEq / L 100 mL/hr at 12/08/11 0800    Time spent: 25 minutes.   LOS: 4 days   [name]  [facility] Pager [phone].  If 8PM-8AM, please contact night-coverage at [email or web address], password [facility] 12/08/2011, 11:58 AM

## 2011-12-08 NOTE — Progress Notes (Signed)
  Subjective: He denies abdominal pain.  Objective: Vital signs in last 24 hours: Temp:  [98.3 F (36.8 C)-98.5 F (36.9 C)] 98.5 F (36.9 C) (11/09 0600) Pulse Rate:  [61-85] 61  (11/09 0600) Resp:  [18-20] 18  (11/09 0600) BP: (107-143)/(71-90) 142/84 mmHg (11/09 0600) SpO2:  [99 %-100 %] 100 % (11/09 0600) Last BM Date: 12/08/11  Intake/Output from previous day: 11/08 0701 - 11/09 0700 In: 4748.3 [P.O.:600; I.V.:2048.3; IV Piggyback:2100] Out: 1 [Stool:1] Intake/Output this shift: Total I/O In: 451.7 [P.O.:240; I.V.:211.7] Out: -   PE: General- In NAD Abdomen-soft, obese, nontender  Lab Results:   Basename 12/08/11 0435 12/06/11 0437  WBC 6.8 11.7*  HGB 13.1 14.5  HCT 38.4* 42.9  PLT 277 301   BMET  Basename 12/08/11 0435 12/06/11 0437  NA 137 137  K 3.6 4.0  CL 103 100  CO2 24 24  GLUCOSE 171* 142*  BUN 5* 7  CREATININE 0.86 0.76  CALCIUM 8.6 9.0   PT/INR  Basename 12/05/11 1145  LABPROT 14.9  INR 1.19   Comprehensive Metabolic Panel:    Component Value Date/Time   NA 137 12/08/2011 0435   K 3.6 12/08/2011 0435   CL 103 12/08/2011 0435   CO2 24 12/08/2011 0435   BUN 5* 12/08/2011 0435   CREATININE 0.86 12/08/2011 0435   CREATININE 0.94 12/04/2011 1102   GLUCOSE 171* 12/08/2011 0435   CALCIUM 8.6 12/08/2011 0435   AST 25 12/04/2011 1102   ALT 26 12/04/2011 1102   ALKPHOS 50 12/04/2011 1102   BILITOT 0.5 12/04/2011 1102   PROT 7.6 12/04/2011 1102   ALBUMIN 3.8 12/04/2011 1102     Studies/Results: No results found.  Anti-infectives: Anti-infectives     Start     Dose/Rate Route Frequency Ordered Stop   12/04/11 1800   ciprofloxacin (CIPRO) IVPB 400 mg        400 mg 200 mL/hr over 60 Minutes Intravenous Every 12 hours 12/04/11 1714     12/04/11 1800   metroNIDAZOLE (FLAGYL) IVPB 500 mg        500 mg 100 mL/hr over 60 Minutes Intravenous Every 8 hours 12/04/11 1714            Assessment Principal Problem: Diverticulitis of large  intestine with perforation and intraabdominal abscess  Obesity, Class III, BMI 40-49.9 (morbid obesity)      LOS: 4 days   Plan: Continue IV antibiotics.  Repeat CT tomorrow.   Varun Jourdan J 12/08/2011

## 2011-12-08 NOTE — Plan of Care (Signed)
Problem: Phase III Progression Outcomes Goal: Activity at appropriate level-compared to baseline (UP IN CHAIR FOR HEMODIALYSIS)  Outcome: Progressing Pt ambulating in hallway independently.

## 2011-12-09 ENCOUNTER — Inpatient Hospital Stay (HOSPITAL_COMMUNITY)
Admission: AD | Admit: 2011-12-09 | Discharge: 2011-12-09 | Disposition: A | Discharge status: Home / Self Care | Payer: BC Managed Care – PPO | Source: Ambulatory Visit | Attending: General Surgery | Admitting: General Surgery

## 2011-12-09 LAB — GLUCOSE, CAPILLARY
Glucose-Capillary: 174 mg/dL — ABNORMAL HIGH (ref 70–99)
Glucose-Capillary: 152 mg/dL — ABNORMAL HIGH (ref 70–99)

## 2011-12-09 MED ORDER — INSULIN ASPART 100 UNIT/ML ~~LOC~~ SOLN
4.0000 [IU] | Freq: Three times a day (TID) | SUBCUTANEOUS | Status: DC
  Administered 2011-12-09 – 2011-12-10 (×4): 4 [IU] via SUBCUTANEOUS

## 2011-12-09 MED ORDER — IOHEXOL 300 MG/ML  SOLN
100.0000 mL | Freq: Once | INTRAMUSCULAR | Status: AC | PRN
  Administered 2011-12-09: 100 mL via INTRAVENOUS

## 2011-12-09 NOTE — Progress Notes (Signed)
[facility] Internal Medicine PROGRESS NOTE  Lawrence Rowland NWG:[id number] DOB: 05-Apr-1980 DOA: 12/04/2011 PCP: Carmelina Dane, MD  Requesting physician: Dr. Gerrit Friends, General Surgery Date of consultation: 12/05/2011  Reason for consultation: Diabetes and HTN management  Brief narrative: Lawrence Rowland is a 31 year old man with sigmoid diverticulitis, admitted 11/28/11 under the care of Dr. Gerrit Friends of surgery.  IM was consulted on 12/05/11 for help with management of DM and HTN.  Assessment/Plan:  Principal Problem:  *Intra-abdominal abscess  Management per general surgery.  Continue antibiotics.  For attempts at percutaneous drainage.  CT scan ordered for 12/09/11. Active Problems:  DM (diabetes mellitus)  Poor outpatient control given marked elevation of hemoglobin A1c of 11.2%.  CBGs 130-234 over past 24 hours.  Continue moderate scale Q AC/HS,  Lantus and meal coverage.  HTN (hypertension)  Continue PO metoprolol.   Obesity, Class III, BMI 40-49.9 (morbid obesity)  Counseled on weight loss with diet/exercise.   Anti-infectives:  Cipro 12/04/11--->  Flagyl 12/04/11--->  HPI/Subjective: [name] reports complete resolution of abdominal pain.  He denies N/V.  Diarrhea has resolved.  Objective: Filed Vitals:   12/08/11 1004 12/08/11 1311 12/08/11 2135 12/09/11 0617  BP: 143/86 146/98 154/90 128/81  Pulse:  55 75 60  Temp:  98.4 F (36.9 C) 97.8 F (36.6 C) 98.6 F (37 C)  TempSrc:  Oral Oral Oral  Resp:  18 20 20   Height:      Weight:      SpO2:  100% 100% 99%    Intake/Output Summary (Last 24 hours) at 12/09/11 0935 Last data filed at 12/09/11 0700  Gross per 24 hour  Intake   2590 ml  Output      0 ml  Net   2590 ml    Exam: Gen:  NAD Cardiovascular:  RRR, No M/R/G Respiratory:  Lungs CTAB Gastrointestinal:  Abdomen soft, NT/ND, + BS Extremities:  No C/E/C  Data Reviewed: Basic Metabolic Panel:  Lab 12/08/11 5784 12/06/11 0437  12/04/11 1102  NA 137 137 137  K 3.6 4.0 --  CL 103 100 100  CO2 24 24 28   GLUCOSE 171* 142* 152*  BUN 5* 7 8  CREATININE 0.86 0.76 0.94  CALCIUM 8.6 9.0 9.2  MG -- -- --  PHOS -- -- --   GFR Estimated Creatinine Clearance: 197.2 ml/min (by C-G formula based on Cr of 0.86). Liver Function Tests:  Lab 12/04/11 1102  AST 25  ALT 26  ALKPHOS 50  BILITOT 0.5  PROT 7.6  ALBUMIN 3.8   Coagulation profile  Lab 12/05/11 1145  INR 1.19  PROTIME --   CBC:  Lab 12/08/11 0435 12/06/11 0437 12/05/11 0407 12/04/11 1104  WBC 6.8 11.7* 12.6* 13.6*  NEUTROABS -- -- -- --  HGB 13.1 14.5 13.5 15.2  HCT 38.4* 42.9 40.0 48.9  MCV 83.7 84.1 83.9 89.5  PLT 277 301 308 --   CBG:  Lab 12/09/11 0738 12/08/11 2041 12/08/11 1729 12/08/11 1130 12/08/11 0738  GLUCAP 174* 178* 130* 234* 184*   Hgb A1c No results found for this basename: HGBA1C:2 in the last 72 hours  Procedures and Diagnostic Studies:  Ct Abdomen Pelvis Wo Contrast 12/05/2011  IMPRESSION:  1.  The right lower quadrant diverticular abscess is currently not amenable to CT guided perforation despite optimal patient positioning. The mesenteric abscess cavity is completely encircled by multiple loops of adherent small bowel.  2.  Recommend intravenous antibiotics and repeat CT abdomen/pelvis with intravenous contrast  in 48 - 96 hours.  If the abscess cavity is resolving, percutaneous drainage may not be required.  If the cavity persists, or enlarges there may be an accessible window at that time.  These results were called by telephone on 12/05/11 at 03:00 p.m. to [name], surgical PA, who verbally acknowledged these results.   Original Report Authenticated By: Malachy Moan, M.D.     Ct Abdomen Pelvis W Contrast 12/04/2011 IMPRESSION: Sigmoid diverticulitis.  Interval development of an associated 5.6 cm abscess in the right lower abdomen.  These results will be called to the ordering clinician or representative by the  Radiologist Assistant, and communication documented in the PACS Dashboard.   Original Report Authenticated By: Charline Bills, M.D.     Ct Abdomen Pelvis W Contrast 11/27/2011  IMPRESSION:  1.  Sigmoid diverticulitis. 2.  A small amount of free air is compatible with a focal perforation, likely contained without an abscess. 3.  Secondary inflammation of at least one loop of small bowel adjacent to the diverticulitis. 4.  Hepatic steatosis.   Original Report Authenticated By: [name]. MATTERN, M.D.     Dg Abd Acute W/chest 12/04/2011 IMPRESSION:  1.  Vessel loops of small bowel in the left abdomen may represent a focal ileus associated with the patients known diverticulitis. 2.  No significant free air or obstruction. 3.  No acute cardiopulmonary disease.   Original Report Authenticated By: Marin Roberts, M.D.     Scheduled Meds:    . ciprofloxacin  400 mg Intravenous Q12H  . insulin aspart  0-15 Units Subcutaneous TID WC  . insulin aspart  0-5 Units Subcutaneous QHS  . insulin aspart  4 Units Subcutaneous TID WC  . insulin glargine  10 Units Subcutaneous QHS  . metoprolol tartrate  12.5 mg Oral BID  . metronidazole  500 mg Intravenous Q8H  . pantoprazole (PROTONIX) IV  40 mg Intravenous QHS  . [DISCONTINUED] insulin aspart  0-15 Units Subcutaneous TID WC  . [DISCONTINUED] insulin aspart  0-5 Units Subcutaneous QHS  . [DISCONTINUED] insulin aspart  4 Units Subcutaneous TID WC  . [DISCONTINUED] insulin glargine  5 Units Subcutaneous QHS   Continuous Infusions:    . 0.9 % NaCl with KCl 20 mEq / L 100 mL/hr at 12/09/11 0248    Time spent: 25 minutes.   LOS: 5 days   Lawrence Rowland  [facility] Pager [phone].  If 8PM-8AM, please contact night-coverage at [email or web address], password [facility] 12/09/2011, 9:35 AM

## 2011-12-09 NOTE — Progress Notes (Addendum)
Pt. Had Ct of the abdomen this AM, the report states the abcess is decreasing in size. Pt requests to have his diet advanced from full liquids. Dr. Purnell Shoemaker ordered to advance it. Pt. Ambulating well without pain. Copy of the CT report given to pt and explained.

## 2011-12-09 NOTE — Plan of Care (Signed)
Problem: Phase II Progression Outcomes Goal: Tolerating diet Outcome: Progressing Diet advanced to carb modified

## 2011-12-09 NOTE — Plan of Care (Signed)
Problem: Phase II Progression Outcomes Goal: Pain controlled Outcome: Adequate for Discharge Pt denies pain

## 2011-12-09 NOTE — Plan of Care (Signed)
Problem: Phase II Progression Outcomes Goal: Progressing with IS, TCDB Outcome: Progressing Incentive spirometer     

## 2011-12-09 NOTE — Progress Notes (Signed)
  Subjective: No complaints.  Objective: Vital signs in last 24 hours: Temp:  [97.8 F (36.6 C)-98.6 F (37 C)] 98.6 F (37 C) (11/10 0617) Pulse Rate:  [55-75] 60  (11/10 0617) Resp:  [18-20] 20  (11/10 0617) BP: (128-154)/(81-98) 128/81 mmHg (11/10 0617) SpO2:  [99 %-100 %] 99 % (11/10 0617) Last BM Date: 12/08/11  Intake/Output from previous day: 11/09 0701 - 11/10 0700 In: 3041.7 [P.O.:480; I.V.:2361.7; IV Piggyback:200] Out: -  Intake/Output this shift:    PE: General- In NAD Abdomen-soft, obese, nontender  Lab Results:   [facility] 12/08/11 0435  WBC 6.8  HGB 13.1  HCT 38.4*  PLT 277   BMET  Basename 12/08/11 0435  NA 137  K 3.6  CL 103  CO2 24  GLUCOSE 171*  BUN 5*  CREATININE 0.86  CALCIUM 8.6   PT/INR No results found for this basename: LABPROT:2,INR:2 in the last 72 hours Comprehensive Metabolic Panel:    Component Value Date/Time   NA 137 12/08/2011 0435   K 3.6 12/08/2011 0435   CL 103 12/08/2011 0435   CO2 24 12/08/2011 0435   BUN 5* 12/08/2011 0435   CREATININE 0.86 12/08/2011 0435   CREATININE 0.94 12/04/2011 1102   GLUCOSE 171* 12/08/2011 0435   CALCIUM 8.6 12/08/2011 0435   AST 25 12/04/2011 1102   ALT 26 12/04/2011 1102   ALKPHOS 50 12/04/2011 1102   BILITOT 0.5 12/04/2011 1102   PROT 7.6 12/04/2011 1102   ALBUMIN 3.8 12/04/2011 1102     Studies/Results: No results found.  Anti-infectives: Anti-infectives     Start     Dose/Rate Route Frequency Ordered Stop   12/04/11 1800   ciprofloxacin (CIPRO) IVPB 400 mg        400 mg 200 mL/hr over 60 Minutes Intravenous Every 12 hours 12/04/11 1714     12/04/11 1800   metroNIDAZOLE (FLAGYL) IVPB 500 mg        500 mg 100 mL/hr over 60 Minutes Intravenous Every 8 hours 12/04/11 1714            Assessment Principal Problem: Diverticulitis of large intestine with perforation and intraabdominal abscess-clinically improving  Obesity, Class III, BMI 40-49.9 (morbid obesity)      LOS: 5  days   Plan: Repeat CT today to reevaluate abscess and see if it can be percutaneously drained.   Shyler Holzman J 12/09/2011

## 2011-12-10 ENCOUNTER — Other Ambulatory Visit (INDEPENDENT_AMBULATORY_CARE_PROVIDER_SITE_OTHER): Payer: Self-pay | Admitting: General Surgery

## 2011-12-10 DIAGNOSIS — K5792 Diverticulitis of intestine, part unspecified, without perforation or abscess without bleeding: Secondary | ICD-10-CM

## 2011-12-10 LAB — GLUCOSE, CAPILLARY
Glucose-Capillary: 166 mg/dL — ABNORMAL HIGH (ref 70–99)
Glucose-Capillary: 151 mg/dL — ABNORMAL HIGH (ref 70–99)

## 2011-12-10 MED ORDER — METRONIDAZOLE 500 MG PO TABS
500.0000 mg | ORAL_TABLET | Freq: Three times a day (TID) | ORAL | Status: DC
  Administered 2011-12-10 – 2011-12-11 (×3): 500 mg via ORAL
  Filled 2011-12-10 (×6): qty 1

## 2011-12-10 MED ORDER — CIPROFLOXACIN HCL 500 MG PO TABS
500.0000 mg | ORAL_TABLET | Freq: Two times a day (BID) | ORAL | Status: DC
  Administered 2011-12-10 – 2011-12-11 (×2): 500 mg via ORAL
  Filled 2011-12-10 (×4): qty 1

## 2011-12-10 NOTE — Progress Notes (Signed)
Pt. States feeling pretty good, ready to go home. Iv changed to a NSL. Pt. Denies any pain. Ambulating frequently. Antibiotics changed from IV to po. Labs to be drawn in AM. Anticipating DC 12-11-11

## 2011-12-10 NOTE — Progress Notes (Signed)
Subjective: He feels fine, no abdominal pain now.  Tolerating PO's well.  Objective: Vital signs in last 24 hours: Temp:  [98 F (36.7 C)-98.5 F (36.9 C)] 98 F (36.7 C) (11/11 0628) Pulse Rate:  [56-62] 56  (11/11 0907) Resp:  [18-20] 18  (11/11 0628) BP: (128-146)/(81-99) 146/86 mmHg (11/11 0907) SpO2:  [95 %-100 %] 98 % (11/11 0628) Last BM Date: 12/09/11  Carb modified diet: afebrile, BP is up still, no labs, CT yesterday shows improving abscess, with decreased sixe of cavity and surrounding inflamation, still no window to drain.  Intake/Output from previous day: 11/10 0701 - 11/11 0700 In: 4376.7 [P.O.:1200; I.V.:2376.7; IV Piggyback:800] Out: 3 [Urine:3] Intake/Output this shift:    General appearance: alert, cooperative and no distress GI: soft, non-tender; bowel sounds normal; no masses,  no organomegaly  Lab Results:   [facility] 12/08/11 0435  WBC 6.8  HGB 13.1  HCT 38.4*  PLT 277    BMET  Basename 12/08/11 0435  NA 137  K 3.6  CL 103  CO2 24  GLUCOSE 171*  BUN 5*  CREATININE 0.86  CALCIUM 8.6   PT/INR No results found for this basename: LABPROT:2,INR:2 in the last 72 hours   Lab 12/04/11 1102  AST 25  ALT 26  ALKPHOS 50  BILITOT 0.5  PROT 7.6  ALBUMIN 3.8     Lipase  No results found for this basename: lipase     Studies/Results: Ct Abdomen Pelvis W Contrast  12/09/2011  *RADIOLOGY REPORT*  Clinical Data: Follow-up of the sigmoid diverticular abscess.  CT ABDOMEN AND PELVIS WITH CONTRAST  Technique:  Multidetector CT imaging of the abdomen and pelvis was performed following the standard protocol during bolus administration of intravenous contrast.  Contrast: OMNIPAQUE IOHEXOL 300 MG/ML  SOLN  Comparison: 12/04/2011  Findings: Abscess in the right lower quadrant just superior to the a redundant proximal sigmoid colon shows improvement since the prior CT evaluation.  Size of the cavity has decreased from approximately 5.5 cm to 4.5  cm and the cavity now is predominately filled with air with a very small fluid component.  Decrease in surrounding inflammation also noted.  There remains very little percutaneous window to the collection which is surrounded by a small bowel.  There may be a slight lateral window available anterior to the iliac crest.  No free intraperitoneal air or bowel obstruction identified. Otherwise stable appearance of the abdomen and pelvis including the solid organs.  No hernias, masses or enlarged lymph nodes identified.  The liver shows diffuse fatty infiltration.  IMPRESSION: Improving diverticular abscess with diminished size of the abscess cavity and decrease in surrounding inflammation.  The abscess cavity now is almost entirely filled with air with a very small fluid component remaining.  There remains very little percutaneous window to the collection due to surrounding small bowel.   Original Report Authenticated By: Irish Lack, M.D.     Medications:    . ciprofloxacin  400 mg Intravenous Q12H  . insulin aspart  0-15 Units Subcutaneous TID WC  . insulin aspart  0-5 Units Subcutaneous QHS  . insulin aspart  4 Units Subcutaneous TID WC  . insulin glargine  10 Units Subcutaneous QHS  . metoprolol tartrate  12.5 mg Oral BID  . metronidazole  500 mg Intravenous Q8H  . pantoprazole (PROTONIX) IV  40 mg Intravenous QHS   Prior to Admission medications   Medication Sig Start Date End Date Taking? Authorizing Provider  ciprofloxacin (CIPRO) 500 MG  tablet Take 1 tablet (500 mg total) by mouth 2 (two) times daily. 12/02/11  Yes Ardeth Sportsman, MD  glipiZIDE (GLUCOTROL) 5 MG tablet Take 2.5 mg by mouth 2 (two) times daily before a meal.   Yes Historical Provider, MD  insulin glargine (LANTUS) 100 UNIT/ML injection Inject 25 Units into the skin at bedtime. 12/02/11  Yes Richarda Overlie, MD  metoprolol tartrate (LOPRESSOR) 25 MG tablet Take 12.5 mg by mouth 2 (two) times daily.   Yes Historical Provider, MD    metroNIDAZOLE (FLAGYL) 500 MG tablet Take 1 tablet (500 mg total) by mouth 3 (three) times daily. 12/02/11  Yes Ardeth Sportsman, MD  glucose blood (GLUCOMETER ELITE TEST STRIPS) test strip Use as instructed 12/02/11   Richarda Overlie, MD  INSULIN SYRINGE .5CC/29G 29G X 1/2" 0.5 ML MISC Dispense 150 syringes 12/02/11   Richarda Overlie, MD    Assessment/Plan Sigmoid Diverticulitis with perforation; med rx 10/30-11/03 now back with more pain and 5.6 Cm abscess RLQ.  AODM ID HbA1c 13.5 (11/29/11)  Hypertension  Body mass index is 46.57   Plan:  He looks good.  I am going to transition him to PO antibiotics, check CBC in AM, set up for repeat CT 12/17/11 as outpatient, and follow up with Dr. Antonieta Pert 12/18/11.  I will ask Dr.Rama to review d/c meds for AODM and BP.  He has follow up pending with [facility], [address]       LOS: 6 days    Oanh Devivo 12/10/2011

## 2011-12-10 NOTE — Plan of Care (Signed)
Problem: Phase III Progression Outcomes Goal: IV changed to normal saline lock Outcome: Adequate for Discharge IV changed to NSL

## 2011-12-10 NOTE — Plan of Care (Signed)
Problem: Phase III Progression Outcomes Goal: Discharge plan remains appropriate-arrangements made Outcome: Adequate for Discharge Anticipating DC 12-11-11

## 2011-12-10 NOTE — Progress Notes (Signed)
I have seen and examined the patient and agree with the assessment and plans.  Khanh Cordner A. Arrion Broaddus  MD, FACS  

## 2011-12-10 NOTE — Progress Notes (Signed)
[facility] Internal Medicine PROGRESS NOTE  Daevon Holdren BMW:[id number] DOB: 30-Jun-1980 DOA: 12/04/2011 PCP: Carmelina Dane, MD  Requesting physician: Dr. Gerrit Friends, General Surgery Date of consultation: 12/05/2011  Reason for consultation: Diabetes and HTN management  Brief narrative: Mr. Suarez is a 31 year old man with sigmoid diverticulitis, admitted 11/28/11 under the care of Dr. Gerrit Friends of surgery.  IM was consulted on 12/05/11 for help with management of DM and HTN.  Assessment/Plan:  Principal Problem:  *Intra-abdominal abscess  Management per general surgery.  Continue antibiotics. CT scan ordered done 12/09/11 showed improvement in size of abscess. Active Problems:  DM (diabetes mellitus)  Poor outpatient control given marked elevation of hemoglobin A1c of 11.2%.  CBGs 143-166 over past 24 hours.  Continue moderate scale Q AC/HS,  Lantus and meal coverage.  If he is discharged home, would send home on Lantus 10 units Q HS and 4 units of Novalog Q AC.  HTN (hypertension)  Continue PO metoprolol at discharge.   Obesity, Class III, BMI 40-49.9 (morbid obesity)  Counseled on weight loss with diet/exercise.   Anti-infectives:  Cipro 12/04/11--->  Flagyl 12/04/11--->  HPI/Subjective: [name] reports complete resolution of abdominal pain.  He denies N/V.  Diarrhea has resolved.  Eating a solid food diet now.  Objective: Filed Vitals:   12/09/11 1400 12/09/11 2139 12/10/11 0628 12/10/11 0907  BP: 130/99 141/86 146/86 146/86  Pulse: 60 62 56 56  Temp: 98.2 F (36.8 C) 98.5 F (36.9 C) 98 F (36.7 C)   TempSrc: Oral Oral Oral   Resp: 20 20 18    Height:      Weight:      SpO2: 100% 95% 98%     Intake/Output Summary (Last 24 hours) at 12/10/11 1313 Last data filed at 12/10/11 0616  Gross per 24 hour  Intake 2576.66 ml  Output      1 ml  Net 2575.66 ml    Exam: Gen:  NAD Cardiovascular:  RRR, No M/R/G Respiratory:  Lungs  CTAB Gastrointestinal:  Abdomen soft, NT/ND, + BS Extremities:  No C/E/C  Data Reviewed: Basic Metabolic Panel:  Lab 12/08/11 2725 12/06/11 0437 12/04/11 1102  NA 137 137 137  K 3.6 4.0 --  CL 103 100 100  CO2 24 24 28   GLUCOSE 171* 142* 152*  BUN 5* 7 8  CREATININE 0.86 0.76 0.94  CALCIUM 8.6 9.0 9.2  MG -- -- --  PHOS -- -- --   GFR Estimated Creatinine Clearance: 197.2 ml/min (by C-G formula based on Cr of 0.86). Liver Function Tests:  Lab 12/04/11 1102  AST 25  ALT 26  ALKPHOS 50  BILITOT 0.5  PROT 7.6  ALBUMIN 3.8   Coagulation profile  Lab 12/05/11 1145  INR 1.19  PROTIME --   CBC:  Lab 12/08/11 0435 12/06/11 0437 12/05/11 0407 12/04/11 1104  WBC 6.8 11.7* 12.6* 13.6*  NEUTROABS -- -- -- --  HGB 13.1 14.5 13.5 15.2  HCT 38.4* 42.9 40.0 48.9  MCV 83.7 84.1 83.9 89.5  PLT 277 301 308 --   CBG:  Lab 12/10/11 1228 12/10/11 0750 12/09/11 2142 12/09/11 1622 12/09/11 1222  GLUCAP 151* 166* 143* 140* 152*   Hgb A1c No results found for this basename: HGBA1C:2 in the last 72 hours  Procedures and Diagnostic Studies:  Ct Abdomen Pelvis Wo Contrast 12/05/2011  IMPRESSION:  1.  The right lower quadrant diverticular abscess is currently not amenable to CT guided perforation despite optimal patient positioning. The  mesenteric abscess cavity is completely encircled by multiple loops of adherent small bowel.  2.  Recommend intravenous antibiotics and repeat CT abdomen/pelvis with intravenous contrast in 48 - 96 hours.  If the abscess cavity is resolving, percutaneous drainage may not be required.  If the cavity persists, or enlarges there may be an accessible window at that time.  These results were called by telephone on 12/05/11 at 03:00 p.m. to [name], surgical PA, who verbally acknowledged these results.   Original Report Authenticated By: Malachy Moan, M.D.     Ct Abdomen Pelvis W Contrast 12/04/2011 IMPRESSION: Sigmoid diverticulitis.  Interval  development of an associated 5.6 cm abscess in the right lower abdomen.  These results will be called to the ordering clinician or representative by the Radiologist Assistant, and communication documented in the PACS Dashboard.   Original Report Authenticated By: Charline Bills, M.D.     Ct Abdomen Pelvis W Contrast 11/27/2011  IMPRESSION:  1.  Sigmoid diverticulitis. 2.  A small amount of free air is compatible with a focal perforation, likely contained without an abscess. 3.  Secondary inflammation of at least one loop of small bowel adjacent to the diverticulitis. 4.  Hepatic steatosis.   Original Report Authenticated By: [name]. MATTERN, M.D.     Dg Abd Acute W/chest 12/04/2011 IMPRESSION:  1.  Vessel loops of small bowel in the left abdomen may represent a focal ileus associated with the patients known diverticulitis. 2.  No significant free air or obstruction. 3.  No acute cardiopulmonary disease.   Original Report Authenticated By: Marin Roberts, M.D.     Scheduled Meds:    . ciprofloxacin  500 mg Oral BID  . insulin aspart  0-15 Units Subcutaneous TID WC  . insulin aspart  0-5 Units Subcutaneous QHS  . insulin aspart  4 Units Subcutaneous TID WC  . insulin glargine  10 Units Subcutaneous QHS  . metoprolol tartrate  12.5 mg Oral BID  . metroNIDAZOLE  500 mg Oral Q8H  . [DISCONTINUED] ciprofloxacin  400 mg Intravenous Q12H  . [DISCONTINUED] metronidazole  500 mg Intravenous Q8H  . [DISCONTINUED] pantoprazole (PROTONIX) IV  40 mg Intravenous QHS   Continuous Infusions:    . [DISCONTINUED] 0.9 % NaCl with KCl 20 mEq / L 100 mL/hr at 12/10/11 0343    Time spent: 25 minutes.   LOS: 6 days   RAMA,CHRISTINA  [facility] Pager [phone].  If 8PM-8AM, please contact night-coverage at [email or web address], password [facility] 12/10/2011, 1:13 PM

## 2011-12-11 LAB — CBC
HCT: 40.6 % (ref 39.0–52.0)
Hemoglobin: 14 g/dL (ref 13.0–17.0)
MCH: 28.7 pg (ref 26.0–34.0)
MCHC: 34.5 g/dL (ref 30.0–36.0)
RDW: 12.8 % (ref 11.5–15.5)

## 2011-12-11 LAB — BASIC METABOLIC PANEL
BUN: 8 mg/dL (ref 6–23)
Calcium: 9 mg/dL (ref 8.4–10.5)
Creatinine, Ser: 1.1 mg/dL (ref 0.50–1.35)
GFR calc Af Amer: >90 mL/min (ref >90)
GFR calc non Af Amer: 88 mL/min — ABNORMAL LOW (ref >90)
Glucose, Bld: 148 mg/dL — ABNORMAL HIGH (ref 70–99)
Potassium: 3.7 mEq/L (ref 3.5–5.1)

## 2011-12-11 MED ORDER — CIPROFLOXACIN HCL 500 MG PO TABS: 500.0000 mg | ORAL_TABLET | Freq: Two times a day (BID) | ORAL | Status: DC

## 2011-12-11 MED ORDER — METRONIDAZOLE 500 MG PO TABS: 500.0000 mg | ORAL_TABLET | Freq: Three times a day (TID) | ORAL | Status: DC

## 2011-12-11 MED ORDER — OXYCODONE-ACETAMINOPHEN 5-325 MG PO TABS: 1.0000 | ORAL_TABLET | ORAL | Status: DC | PRN

## 2011-12-11 MED ORDER — INSULIN GLARGINE 100 UNIT/ML ~~LOC~~ SOLN: 10.0000 [IU] | Freq: Every day | SUBCUTANEOUS | Status: AC

## 2011-12-11 MED ORDER — INSULIN ASPART 100 UNIT/ML ~~LOC~~ SOLN: 4.0000 [IU] | Freq: Three times a day (TID) | SUBCUTANEOUS | Status: AC

## 2011-12-11 MED ORDER — OXYCODONE-ACETAMINOPHEN 5-325 MG PO TABS: 1.0000 | ORAL_TABLET | ORAL | Status: AC | PRN

## 2011-12-11 NOTE — Progress Notes (Signed)
Patient discharged per MD order. D/C instructions reviewed with patient and wife at bedside. Prescriptions given to patient. Diabetes teaching and times to take insulin (novalog and lantus) both thoroughly reviewed. Patient refused wheelchair and walked to car with wife. Angelena Form, RN

## 2011-12-11 NOTE — Progress Notes (Signed)
I have seen and examined the patient and agree with the assessment and plans.  Jeno Calleros A. Lanae Federer  MD, FACS  

## 2011-12-11 NOTE — Discharge Summary (Signed)
Physician Discharge Summary  Patient ID: Lawrence Rowland MRN: [id number] DOB/AGE: April 29, 1980 31 y.o.  Admit date: 12/04/2011 Discharge date: 12/11/2011  Admission Diagnoses: Diverticulitis with microperforation and abscess  Discharge Diagnoses: Same Principal Problem:  *Intra-abdominal abscess Active Problems:  Diverticulitis of large intestine with perforation  DM (diabetes mellitus)  HTN (hypertension)  Obesity, Class III, BMI 40-49.9 (morbid obesity)   PROCEDURES: None  Hospital Course: Lawrence Rowland is a 31 y.o. male. Was recently seen at the hospital for a microperforation of diverticulitis. Patient was treated with IV antibiotics n.p.o. And bowel rest, the patient did well and was discharged approximately 2 days ago. The patient presents with a prior history of increasing abdominal pain as well as diarrhea. Patient underwent laboratory tests today which revealed elevated white blood cell count at 13.6. The patient states no nausea or fevers while at home. On patient to admission he had no abscess which could be drainable. After readmission his CT was review and no window could be found to drain Sigmoid  diverticulitis, with associated 4.0 x 5.4 x 5.6 cm abscess.  He was maintained on antibiotics and has improved clinically.  Repeat CT on 12/07/11 again showed no window for drainage, size of the cavity had decreased, and was predominately filled with air, and small fluid component.  His medical management was supervised by Dr. Darnelle Catalan of the Hospitalist service. His insulin dose has been adjusted and he was continued on lopressor for his BP.   His antibiotics were switched over to PO on 12/10/11 and he had a normal WBC, with no pain on 12/11/11.  We plan discharge on a total of 3 weeks cipro, flagyl, he has one week still with rx at home. Follow up with CT and Dr. Antonieta Pert next week  Condition on D/C: Improving  Disposition: 01-Home or Self Care  Discharge Orders    Future  Appointments: Provider: Department: Dept Phone: Center:   12/17/2011 10:30 AM [facility] [phone] [facility]   12/18/2011 5:00 PM Axel Filler, MD [facility] [phone] None       Medication List     As of 12/11/2011  9:55 AM    STOP taking these medications         glipiZIDE 5 MG tablet   Commonly known as: GLUCOTROL      TAKE these medications         ciprofloxacin 500 MG tablet   Commonly known as: CIPRO   Take 1 tablet (500 mg total) by mouth 2 (two) times daily.      ciprofloxacin 500 MG tablet   Commonly known as: CIPRO   Take 1 tablet (500 mg total) by mouth 2 (two) times daily.      glucose blood test strip   Use as instructed      insulin aspart 100 UNIT/ML injection   Commonly known as: novoLOG   Inject 4 Units into the skin 3 (three) times daily with meals.      insulin glargine 100 UNIT/ML injection   Commonly known as: LANTUS   Inject 10 Units into the skin at bedtime.      INSULIN SYRINGE .5CC/29G 29G X 1/2" 0.5 ML Misc   Dispense 150 syringes      metoprolol tartrate 25 MG tablet   Commonly known as: LOPRESSOR   Take 12.5 mg by mouth 2 (two) times daily.      metroNIDAZOLE 500 MG tablet   Commonly known as: FLAGYL  Take 1 tablet (500 mg total) by mouth 3 (three) times daily.      metroNIDAZOLE 500 MG tablet   Commonly known as: FLAGYL   Take 1 tablet (500 mg total) by mouth every 8 (eight) hours.      oxyCODONE-acetaminophen 5-325 MG per tablet   Commonly known as: PERCOCET/ROXICET   Take 1-2 tablets by mouth every 4 (four) hours as needed.           Follow-up Information    Follow up with Lajean Saver, MD. On 12/18/2011. (Be at office at 4:30 PM, your appointment is at 4:45 PM)    Contact information:   [address] [place] [address] [phone]       Follow up with CT scan at [facility] as an outpatient.. (Your appointmet is at  10:15 AM.  Go to radiology there and they will get your study completed.Marland Kitchen)       Follow up with Medicine follow up  [address]  Make your follow up appointment and . (Record your glucose readings and your blood pressures at home and take with you for your appointment with medical doctor.)          Signed: Sherrie George 12/11/2011, 9:55 AM

## 2011-12-11 NOTE — Progress Notes (Signed)
Subjective: Feels much better, no pain eating well,  +BM   Objective: Vital signs in last 24 hours: Temp:  [98.1 F (36.7 C)-99.2 F (37.3 C)] 98.6 F (37 C) (11/12 8295) Pulse Rate:  [56-74] 74  (11/12 0638) Resp:  [20] 20  (11/12 0638) BP: (140-153)/(71-96) 140/93 mmHg (11/12 0638) SpO2:  [100 %] 100 % (11/12 0638) Last BM Date: 12/09/11  TM99.2, BP still up some, WBC 5.6, BMP is normal Glucose ranging from 143-178 yesterday.  Intake/Output from previous day: 11/11 0701 - 11/12 0700 In: 720 [P.O.:720] Out: -  Intake/Output this shift:    General appearance: alert, cooperative and no distress GI: soft, non-tender; bowel sounds normal; no masses,  no organomegaly  Lab Results:   [facility] 12/11/11 0403  WBC 5.6  HGB 14.0  HCT 40.6  PLT 303    BMET  Basename 12/11/11 0403  NA 136  K 3.7  CL 99  CO2 30  GLUCOSE 148*  BUN 8  CREATININE 1.10  CALCIUM 9.0   PT/INR No results found for this basename: LABPROT:2,INR:2 in the last 72 hours   Lab 12/04/11 1102  AST 25  ALT 26  ALKPHOS 50  BILITOT 0.5  PROT 7.6  ALBUMIN 3.8     Lipase  No results found for this basename: lipase     Studies/Results: Ct Abdomen Pelvis W Contrast  12/09/2011  *RADIOLOGY REPORT*  Clinical Data: Follow-up of the sigmoid diverticular abscess.  CT ABDOMEN AND PELVIS WITH CONTRAST  Technique:  Multidetector CT imaging of the abdomen and pelvis was performed following the standard protocol during bolus administration of intravenous contrast.  Contrast: OMNIPAQUE IOHEXOL 300 MG/ML  SOLN  Comparison: 12/04/2011  Findings: Abscess in the right lower quadrant just superior to the a redundant proximal sigmoid colon shows improvement since the prior CT evaluation.  Size of the cavity has decreased from approximately 5.5 cm to 4.5 cm and the cavity now is predominately filled with air with a very small fluid component.  Decrease in surrounding inflammation also noted.  There  remains very little percutaneous window to the collection which is surrounded by a small bowel.  There may be a slight lateral window available anterior to the iliac crest.  No free intraperitoneal air or bowel obstruction identified. Otherwise stable appearance of the abdomen and pelvis including the solid organs.  No hernias, masses or enlarged lymph nodes identified.  The liver shows diffuse fatty infiltration.  IMPRESSION: Improving diverticular abscess with diminished size of the abscess cavity and decrease in surrounding inflammation.  The abscess cavity now is almost entirely filled with air with a very small fluid component remaining.  There remains very little percutaneous window to the collection due to surrounding small bowel.   Original Report Authenticated By: Irish Lack, M.D.     Medications:    . ciprofloxacin  500 mg Oral BID  . insulin aspart  0-15 Units Subcutaneous TID WC  . insulin aspart  0-5 Units Subcutaneous QHS  . insulin aspart  4 Units Subcutaneous TID WC  . insulin glargine  10 Units Subcutaneous QHS  . metoprolol tartrate  12.5 mg Oral BID  . metroNIDAZOLE  500 mg Oral Q8H  . [DISCONTINUED] ciprofloxacin  400 mg Intravenous Q12H  . [DISCONTINUED] metronidazole  500 mg Intravenous Q8H  . [DISCONTINUED] pantoprazole (PROTONIX) IV  40 mg Intravenous QHS    Assessment/Plan Sigmoid Diverticulitis with perforation; med rx 10/30-11/03 now back with more pain and 5.6 Cm abscess RLQ.  AODM ID HbA1c 13.5 (11/29/11)  Hypertension  Body mass index is 46.57    Plan:  Home today, with 2 more weeks in addition to the 1 week of antibiotics he already has at home. Follow up with medicine for BP and AODM.  Ct scan next week and follow up with Dr. Antonieta Pert  LOS: 7 days    Lawrence Rowland 12/11/2011

## 2011-12-11 NOTE — Discharge Summary (Signed)
I have seen and examined the patient and agree with the assessment and plans.  Doyal Saric A. Harriet Bollen  MD, FACS  

## 2011-12-13 ENCOUNTER — Encounter (INDEPENDENT_AMBULATORY_CARE_PROVIDER_SITE_OTHER): Payer: BC Managed Care – PPO | Admitting: General Surgery

## 2011-12-17 ENCOUNTER — Ambulatory Visit
Admit: 2011-12-17 | Discharge: 2011-12-17 | Disposition: A | Discharge status: Home / Self Care | Payer: BC Managed Care – PPO | Attending: General Surgery | Admitting: General Surgery

## 2011-12-17 DIAGNOSIS — K5792 Diverticulitis of intestine, part unspecified, without perforation or abscess without bleeding: Secondary | ICD-10-CM

## 2011-12-17 MED ORDER — IOHEXOL 300 MG/ML  SOLN
125.0000 mL | Freq: Once | INTRAMUSCULAR | Status: AC | PRN
  Administered 2011-12-17: 125 mL via INTRAVENOUS

## 2011-12-18 ENCOUNTER — Ambulatory Visit (INDEPENDENT_AMBULATORY_CARE_PROVIDER_SITE_OTHER): Payer: BC Managed Care – PPO | Admitting: General Surgery

## 2011-12-18 ENCOUNTER — Encounter (INDEPENDENT_AMBULATORY_CARE_PROVIDER_SITE_OTHER): Payer: Self-pay | Admitting: General Surgery

## 2011-12-18 VITALS — BP 127/81 | HR 74 | Temp 97.8°F | Resp 16 | Ht 73.0 in | Wt 349.6 lb

## 2011-12-18 DIAGNOSIS — K5792 Diverticulitis of intestine, part unspecified, without perforation or abscess without bleeding: Secondary | ICD-10-CM

## 2011-12-18 DIAGNOSIS — K5732 Diverticulitis of large intestine without perforation or abscess without bleeding: Secondary | ICD-10-CM

## 2011-12-18 NOTE — Progress Notes (Signed)
Patient ID: Lawrence Rowland, male   DOB: 1980/10/14, 31 y.o.   MRN: [id number] The patient is a 31 year old male status post diverticular abscess the patient was recently discharged after about a IV antibiotics. The patient's abscess at that time was unable to be drained by interventional radiology.  This has been doing well postoperatively did on a regular diet. It had normal bowel function. Repeat CT scan that does reveal smaller abscess in the right lower quadrant.  On exam: Abdomen soft nontender nondistended with active bowel sounds.  Assessment and plan: Patient followup in one month for resolution of symptoms. Patient will need followup with GI doctor for colonoscopy to evaluate for diverticulosis versus possible carcinoma All questions were answered to the patient's satisfaction

## 2012-01-18 ENCOUNTER — Encounter (INDEPENDENT_AMBULATORY_CARE_PROVIDER_SITE_OTHER): Payer: Self-pay | Admitting: General Surgery

## 2012-01-18 ENCOUNTER — Encounter: Payer: Self-pay | Admitting: Gastroenterology

## 2012-01-18 ENCOUNTER — Encounter (INDEPENDENT_AMBULATORY_CARE_PROVIDER_SITE_OTHER): Payer: BC Managed Care – PPO | Admitting: General Surgery

## 2012-01-18 ENCOUNTER — Other Ambulatory Visit (INDEPENDENT_AMBULATORY_CARE_PROVIDER_SITE_OTHER): Payer: Self-pay | Admitting: General Surgery

## 2012-01-18 ENCOUNTER — Ambulatory Visit (INDEPENDENT_AMBULATORY_CARE_PROVIDER_SITE_OTHER): Payer: BC Managed Care – PPO | Admitting: General Surgery

## 2012-01-18 VITALS — BP 132/74 | HR 78 | Temp 96.8°F | Ht 73.0 in | Wt 348.8 lb

## 2012-01-18 DIAGNOSIS — K5792 Diverticulitis of intestine, part unspecified, without perforation or abscess without bleeding: Secondary | ICD-10-CM

## 2012-01-18 DIAGNOSIS — K5732 Diverticulitis of large intestine without perforation or abscess without bleeding: Secondary | ICD-10-CM

## 2012-01-18 NOTE — Progress Notes (Signed)
Patient ID: Lawrence Rowland, male   DOB: Jan 10, 1981, 31 y.o.   MRN: [id number] The patient is a 31 year old male who recently was admitted and discharged to the hospital for diverticulitis and abscess formation. Since last visit followup CT scan which revealed a small abscess. Patient as been doing well with minimal abdominal cramping tolerating her diet, and having normal bowel function.  Assessment: Diverticulitis  Plan: We will have the patient referred to GI for possible sigmoidoscopy versus colonoscopy to confirm diverticulitis  And rule out any carcinoma.  The patient can follow up when necessary.

## 2012-02-06 ENCOUNTER — Telehealth: Payer: Self-pay | Admitting: *Deleted

## 2012-02-06 ENCOUNTER — Ambulatory Visit (AMBULATORY_SURGERY_CENTER): Payer: BC Managed Care – PPO | Admitting: *Deleted

## 2012-02-06 VITALS — Ht 73.0 in | Wt 348.8 lb

## 2012-02-06 DIAGNOSIS — K5792 Diverticulitis of intestine, part unspecified, without perforation or abscess without bleeding: Secondary | ICD-10-CM

## 2012-02-06 DIAGNOSIS — Z1211 Encounter for screening for malignant neoplasm of colon: Secondary | ICD-10-CM

## 2012-02-06 DIAGNOSIS — Z800 Family history of malignant neoplasm of digestive organs: Secondary | ICD-10-CM

## 2012-02-06 DIAGNOSIS — K5732 Diverticulitis of large intestine without perforation or abscess without bleeding: Secondary | ICD-10-CM

## 2012-02-06 MED ORDER — NA SULFATE-K SULFATE-MG SULF 17.5-3.13-1.6 GM/177ML PO SOLN: 1.0000 | Freq: Once | ORAL | Status: AC

## 2012-02-06 NOTE — Telephone Encounter (Signed)
This is a new patient to you scheduled for a colonoscopy wed 02-27-12 for screening, fam history of colon cancer in mother, and diverticulitis with a perforation 11-26-12. Pt questioned if it was safe to do this procedure this soon or if he needed to cancel and reschedule. He was admitted to hospital 11-27-11 for diverticulitis with perf with no surgery ( dx'd via ct scan) and then readmitted 11-5 for an abdominal abcess. Surgery did attempt to drain the abcess but was unable to reach it per pt. He is also diabetic. I also didn't know if he needed an office visit with you first prior to his colonoscopy.  Pt states he has completed all his antibiotics, has no fever, no bleeding but has an occasional area of abdominal discomfort that is intermittent.   Please advise. Thanks  Hilda Lias

## 2012-02-06 NOTE — Progress Notes (Signed)
No allergy to eggs or soy per pt. ewm

## 2012-02-07 NOTE — Telephone Encounter (Signed)
Pt scheduled for office visit with Dr. Arlyce Dice 2/10.  Colonoscopy scheduled for 1/29 cancelled.  Pt aware of appointment

## 2012-02-07 NOTE — Telephone Encounter (Signed)
He needs an OV before procedure.

## 2012-02-27 ENCOUNTER — Encounter: Payer: Self-pay | Admitting: Gastroenterology

## 2012-03-10 ENCOUNTER — Ambulatory Visit: Payer: Self-pay | Admitting: Gastroenterology

## 2013-03-08 IMAGING — CT CT ABD-PELV W/O CM
1 of 3 series · 14 of 32 positions shown, 19 images · non-contrast
Comparison: CT abdomen/pelvis 12/04/2011

CLINICAL DATA: 31-year-old male with perforated divert to colitis
and focal diverticular abscess in the right lower quadrant.  He
presents for an attempt at CT guided aspiration/drain placement.

CT ABDOMEN AND PELVIS WITHOUT CONTRAST
TECHNIQUE: Multidetector CT imaging of the abdomen and pelvis was
performed following the standard protocol without intravenous
contrast.

[Series 3: abscess drain · axial · 0.86mm/px · z∈[-278,-3]mm · 14 of 63 slices shown, 19 images]
[im 4/63  soft-tissue]
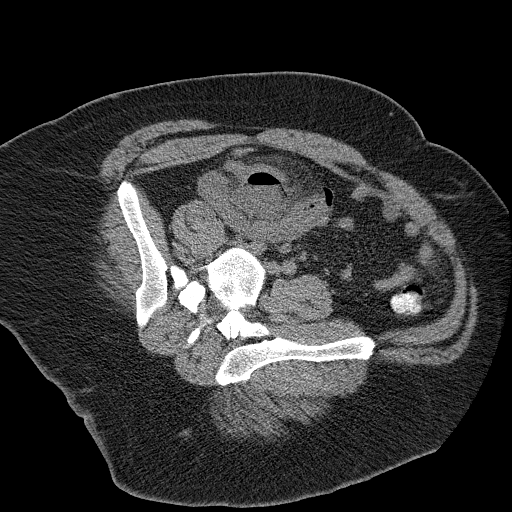
[im 4/63  bone]
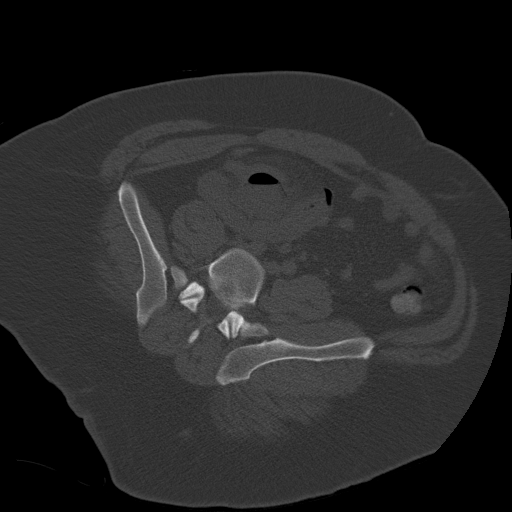
[im 10/63  soft-tissue]
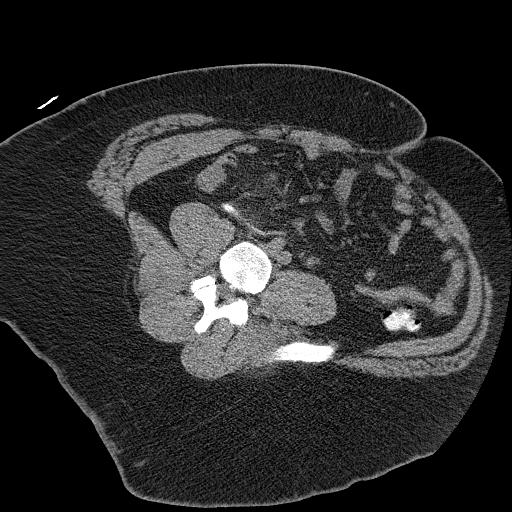
[im 13/63  soft-tissue]
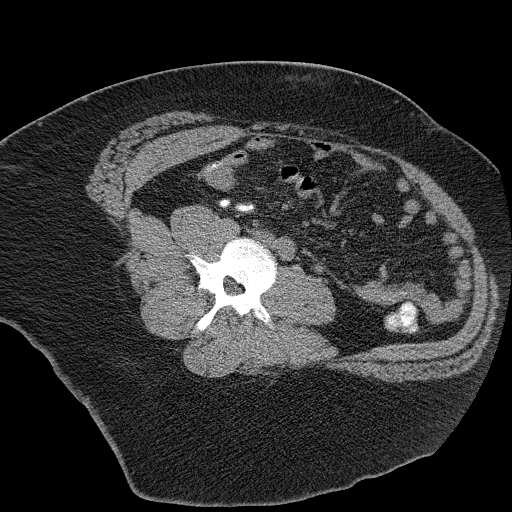
[im 19/63  soft-tissue]
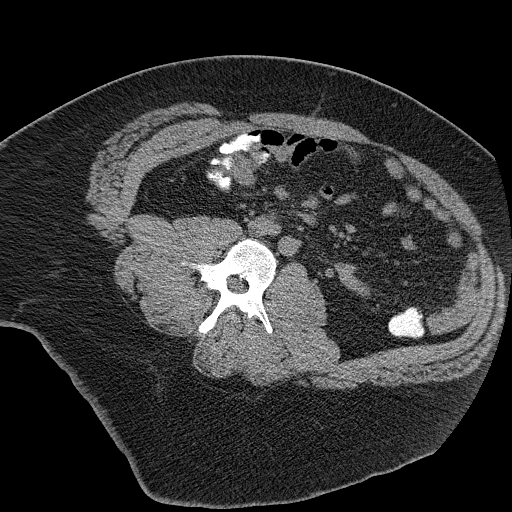
[im 22/63  soft-tissue]
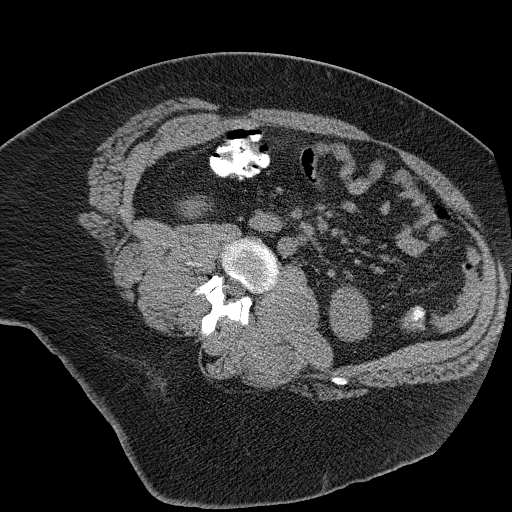
[im 28/63  soft-tissue]
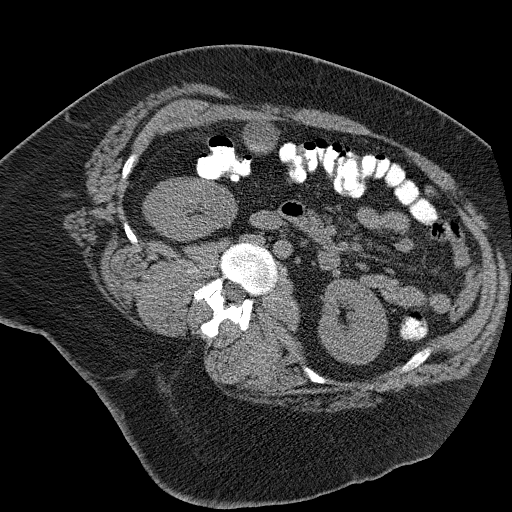
[im 32/63  soft-tissue]
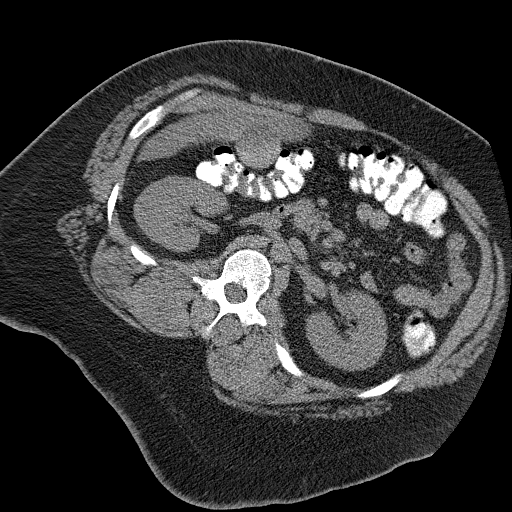
[im 35/63  soft-tissue]
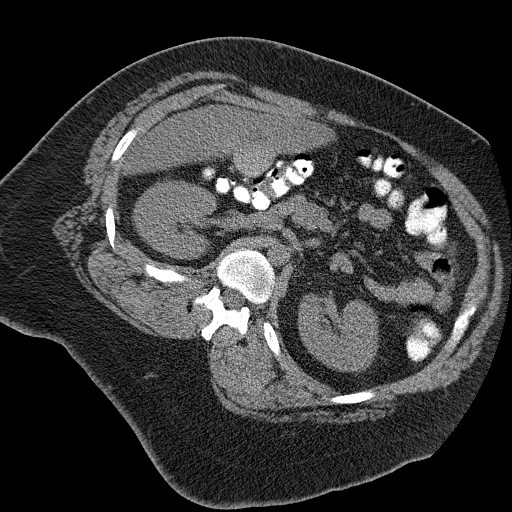
[im 41/63  soft-tissue]
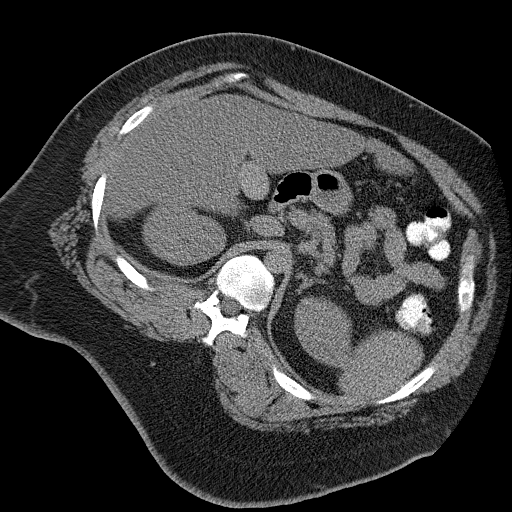
[im 41/63  bone]
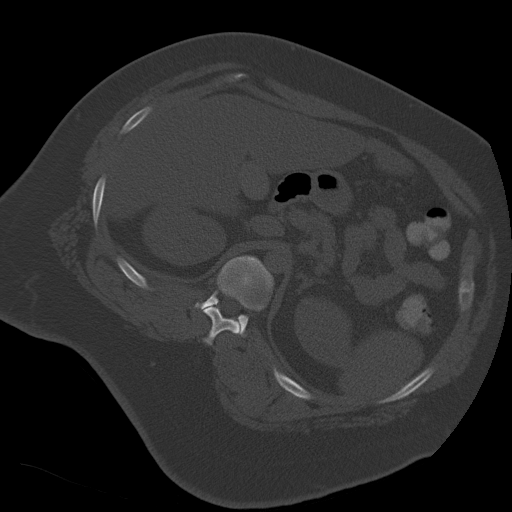
[im 44/63  soft-tissue]
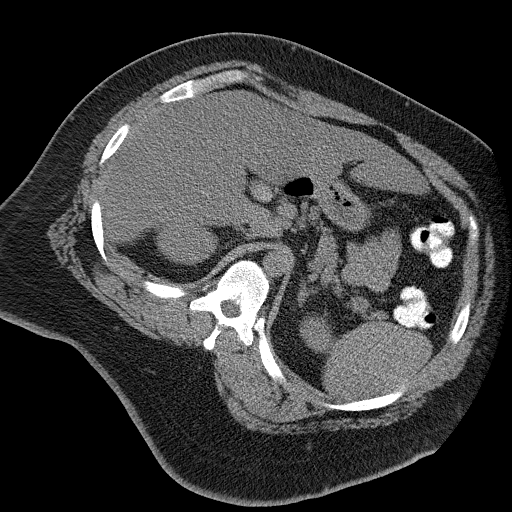
[im 50/63  soft-tissue]
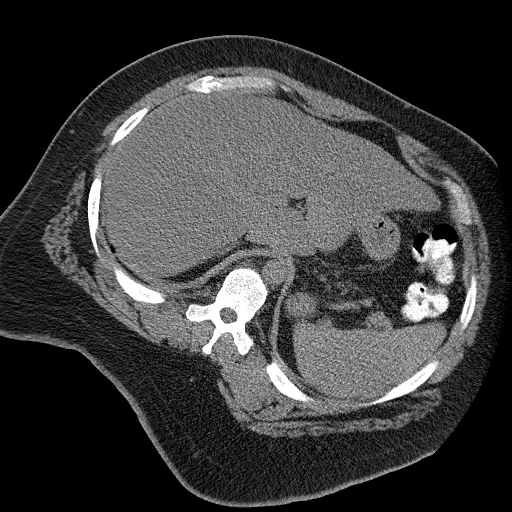
[im 50/63  lung]
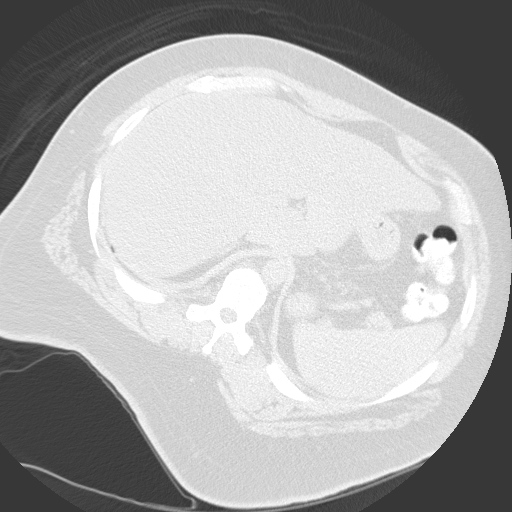
[im 53/63  soft-tissue]
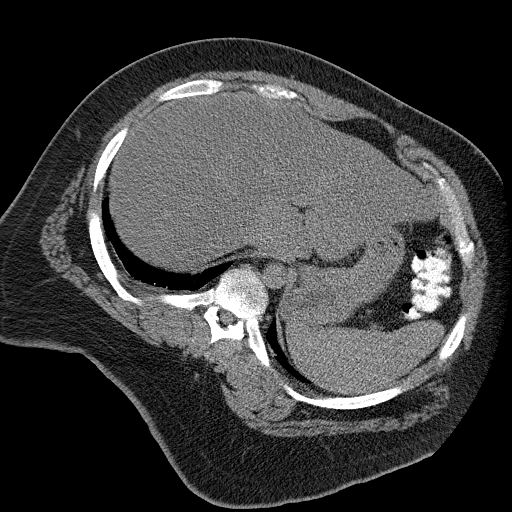
[im 53/63  lung]
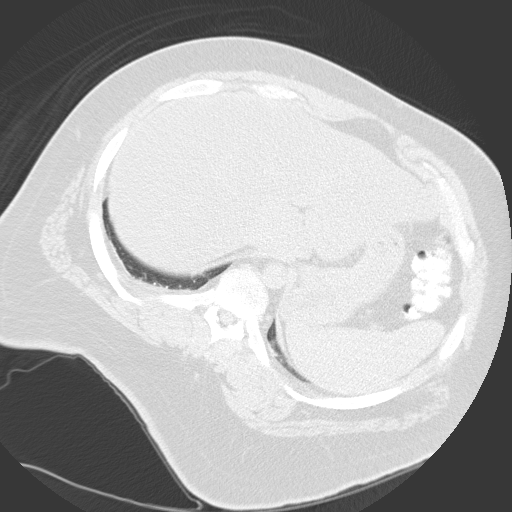
[im 56/63  lung]
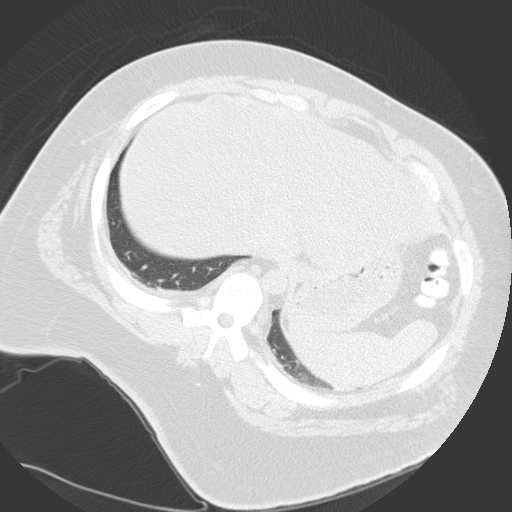
[im 59/63  soft-tissue]
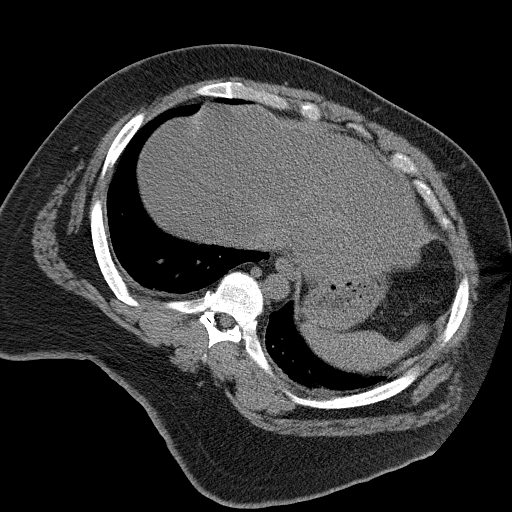
[im 59/63  lung]
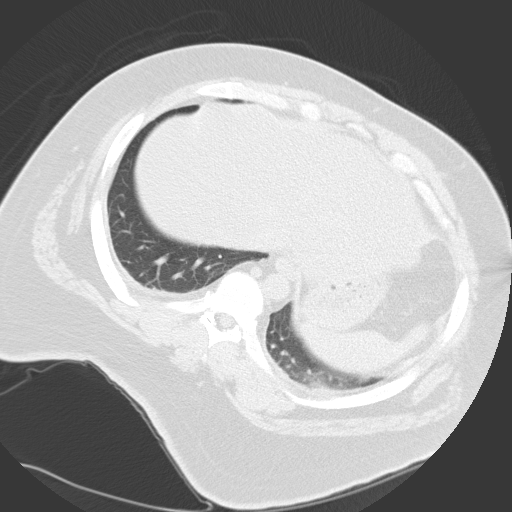

[14 of 32 positions shown; findings below may reference images not displayed]

FINDINGS: The patient was placed in a partial left lateral
decubitus position to facilitate movement of bowel loops away from
the diverticular abscess site.  A planning axial CT scan was
performed.  Unfortunately, there is no safe window into the abscess
cavity.  There are multiple loops of small bowel encircling and
adherent to the abscess cavity and surrounding mesenteric phlegmon.
The procedure was terminated the patient was returned to his room.
IMPRESSION: 1.  The right lower quadrant diverticular abscess is currently not
amenable to CT guided perforation despite optimal patient
positioning. The mesenteric abscess cavity is completely encircled
by multiple loops of adherent small bowel.

2.  Recommend intravenous antibiotics and repeat CT abdomen/pelvis
with intravenous contrast in 48 - 96 hours.  If the abscess cavity
is resolving, percutaneous drainage may not be required.  If the
cavity persists, or enlarges there may be an accessible window at
that time.

These results were called by telephone on 12/05/11 at [DATE] p.m. to
Zdeinik Heyabe, surgical PA, who verbally acknowledged these
results.

## 2016-11-23 ENCOUNTER — Other Ambulatory Visit: Payer: Self-pay | Admitting: Nurse Practitioner

## 2016-11-23 ENCOUNTER — Ambulatory Visit
Admission: RE | Admit: 2016-11-23 | Discharge: 2016-11-23 | Disposition: A | Discharge status: Home / Self Care | Payer: BLUE CROSS/BLUE SHIELD | Source: Ambulatory Visit | Attending: Nurse Practitioner | Admitting: Nurse Practitioner

## 2016-11-23 DIAGNOSIS — M5489 Other dorsalgia: Secondary | ICD-10-CM

## 2018-09-24 ENCOUNTER — Other Ambulatory Visit: Payer: Self-pay

## 2018-09-24 DIAGNOSIS — Z20822 Contact with and (suspected) exposure to covid-19: Secondary | ICD-10-CM

## 2018-09-25 LAB — NOVEL CORONAVIRUS, NAA: SARS-CoV-2, NAA: NOT DETECTED

## 2018-10-09 ENCOUNTER — Other Ambulatory Visit: Payer: Self-pay

## 2018-10-09 DIAGNOSIS — Z20822 Contact with and (suspected) exposure to covid-19: Secondary | ICD-10-CM

## 2018-10-10 LAB — NOVEL CORONAVIRUS, NAA: SARS-CoV-2, NAA: NOT DETECTED

## 2019-04-10 ENCOUNTER — Ambulatory Visit: Payer: Self-pay | Attending: Internal Medicine

## 2019-04-10 DIAGNOSIS — Z23 Encounter for immunization: Secondary | ICD-10-CM

## 2019-04-10 NOTE — Progress Notes (Signed)
   Covid-19 Vaccination Clinic  Name:  Michio Thier    MRN: [id number] DOB: 28-Sep-1980  04/10/2019  Mr. Snow-Barnes was observed post Covid-19 immunization for 15 minutes without incident. He was provided with Vaccine Information Sheet and instruction to access the V-Safe system.   Mr. Touhey was instructed to call 911 with any severe reactions post vaccine: Marland Kitchen Difficulty breathing  . Swelling of face and throat  . A fast heartbeat  . A bad rash all over body  . Dizziness and weakness   Immunizations Administered    Name Date Dose VIS Date Route   Pfizer COVID-19 Vaccine 04/10/2019  3:39 PM 0.3 mL 01/09/2019 Intramuscular   Manufacturer: [employer], [employer]   Lot: [id number]   NDC: [address]-2

## 2019-05-04 ENCOUNTER — Ambulatory Visit: Payer: Self-pay | Attending: Internal Medicine

## 2019-05-04 DIAGNOSIS — Z23 Encounter for immunization: Secondary | ICD-10-CM

## 2019-05-04 NOTE — Progress Notes (Signed)
   Covid-19 Vaccination Clinic  Name:  Lawrence Rowland    MRN: [id number] DOB: February 18, 1980  05/04/2019  Lawrence Rowland was observed post Covid-19 immunization for 15 minutes without incident. He was provided with Vaccine Information Sheet and instruction to access the V-Safe system.   Lawrence Rowland was instructed to call 911 with any severe reactions post vaccine: Marland Kitchen Difficulty breathing  . Swelling of face and throat  . A fast heartbeat  . A bad rash all over body  . Dizziness and weakness   Immunizations Administered    Name Date Dose VIS Date Route   Pfizer COVID-19 Vaccine 05/04/2019  4:44 PM 0.3 mL 01/09/2019 Intramuscular   Manufacturer: [employer], [employer]   Lot: [id number]   NDC: [address]-2

## 2019-08-26 ENCOUNTER — Ambulatory Visit: Payer: BC Managed Care – PPO | Attending: Internal Medicine

## 2019-08-26 DIAGNOSIS — Z20822 Contact with and (suspected) exposure to covid-19: Secondary | ICD-10-CM | POA: Insufficient documentation

## 2019-08-27 LAB — SARS-COV-2, NAA 2 DAY TAT

## 2019-08-27 LAB — NOVEL CORONAVIRUS, NAA: SARS-CoV-2, NAA: NOT DETECTED
